# Patient Record
Sex: Female | Born: 1990 | Race: Black or African American | Hispanic: No | Marital: Single | Smoking: Former smoker
Health system: Southern US, Community
[De-identification: ages and names within clinical notes are randomized; demographics above are authoritative.]

## PROBLEM LIST (undated history)

## (undated) DIAGNOSIS — G7100 Muscular dystrophy, unspecified: Secondary | ICD-10-CM

## (undated) DIAGNOSIS — G600 Hereditary motor and sensory neuropathy: Secondary | ICD-10-CM

## (undated) HISTORY — PX: EXTERNAL EAR SURGERY: SHX627

---
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
## 2011-02-14 ENCOUNTER — Emergency Department (HOSPITAL_COMMUNITY)
Admission: EM | Admit: 2011-02-14 | Discharge: 2011-02-15 | Disposition: A | Discharge status: Home / Self Care | Payer: Self-pay | Attending: Emergency Medicine | Admitting: Emergency Medicine

## 2011-02-14 DIAGNOSIS — E860 Dehydration: Secondary | ICD-10-CM | POA: Insufficient documentation

## 2011-02-14 DIAGNOSIS — R112 Nausea with vomiting, unspecified: Secondary | ICD-10-CM | POA: Insufficient documentation

## 2011-02-14 DIAGNOSIS — R42 Dizziness and giddiness: Secondary | ICD-10-CM | POA: Insufficient documentation

## 2011-02-14 DIAGNOSIS — R1013 Epigastric pain: Secondary | ICD-10-CM | POA: Insufficient documentation

## 2011-02-14 DIAGNOSIS — K5900 Constipation, unspecified: Secondary | ICD-10-CM | POA: Insufficient documentation

## 2011-02-14 DIAGNOSIS — G7109 Other specified muscular dystrophies: Secondary | ICD-10-CM | POA: Insufficient documentation

## 2011-02-14 LAB — DIFFERENTIAL
Basophils Absolute: 0 10*3/uL (ref 0.0–0.1)
Basophils Relative: 0 % (ref 0–1)
Eosinophils Relative: 2 % (ref 0–5)
Lymphocytes Relative: 49 % — ABNORMAL HIGH (ref 12–46)
Neutro Abs: 3.8 10*3/uL (ref 1.7–7.7)

## 2011-02-14 LAB — CBC
HCT: 34.6 % — ABNORMAL LOW (ref 36.0–46.0)
Hemoglobin: 12.1 g/dL (ref 12.0–15.0)
RDW: 13.9 % (ref 11.5–15.5)
WBC: 9.3 10*3/uL (ref 4.0–10.5)

## 2011-02-15 LAB — URINALYSIS, ROUTINE W REFLEX MICROSCOPIC
Hgb urine dipstick: NEGATIVE
Nitrite: NEGATIVE
Protein, ur: NEGATIVE mg/dL
Specific Gravity, Urine: 1.027 (ref 1.005–1.030)
Urobilinogen, UA: 1 mg/dL (ref 0.0–1.0)

## 2011-02-15 LAB — HEPATIC FUNCTION PANEL
Albumin: 3.3 g/dL — ABNORMAL LOW (ref 3.5–5.2)
Alkaline Phosphatase: 82 U/L (ref 39–117)
Total Protein: 6.6 g/dL (ref 6.0–8.3)

## 2011-02-15 LAB — BASIC METABOLIC PANEL
Calcium: 8.8 mg/dL (ref 8.4–10.5)
GFR calc Af Amer: >60 mL/min (ref >60)
GFR calc non Af Amer: >60 mL/min (ref >60)
Glucose, Bld: 89 mg/dL (ref 70–99)
Potassium: 4 mEq/L (ref 3.5–5.1)
Sodium: 138 mEq/L (ref 135–145)

## 2011-02-15 LAB — LIPASE, BLOOD: Lipase: 19 U/L (ref 11–59)

## 2013-11-24 ENCOUNTER — Encounter (HOSPITAL_BASED_OUTPATIENT_CLINIC_OR_DEPARTMENT_OTHER): Payer: Self-pay | Admitting: Emergency Medicine

## 2013-11-24 ENCOUNTER — Emergency Department (HOSPITAL_BASED_OUTPATIENT_CLINIC_OR_DEPARTMENT_OTHER)
Admission: EM | Admit: 2013-11-24 | Discharge: 2013-11-24 | Disposition: A | Discharge status: Home / Self Care | Payer: Self-pay | Attending: Emergency Medicine | Admitting: Emergency Medicine

## 2013-11-24 DIAGNOSIS — K044 Acute apical periodontitis of pulpal origin: Secondary | ICD-10-CM | POA: Insufficient documentation

## 2013-11-24 DIAGNOSIS — F17200 Nicotine dependence, unspecified, uncomplicated: Secondary | ICD-10-CM | POA: Insufficient documentation

## 2013-11-24 DIAGNOSIS — K047 Periapical abscess without sinus: Secondary | ICD-10-CM | POA: Insufficient documentation

## 2013-11-24 DIAGNOSIS — K029 Dental caries, unspecified: Secondary | ICD-10-CM | POA: Insufficient documentation

## 2013-11-24 MED ORDER — TRAMADOL HCL 50 MG PO TABS
50.0000 mg | ORAL_TABLET | Freq: Four times a day (QID) | ORAL | Status: DC | PRN
Start: 2013-11-24 — End: 2015-09-12

## 2013-11-24 MED ORDER — AMOXICILLIN 500 MG PO CAPS: 500.0000 mg | ORAL_CAPSULE | Freq: Three times a day (TID) | ORAL | Status: DC

## 2013-11-24 NOTE — ED Notes (Signed)
Toothache since last night

## 2013-11-24 NOTE — ED Provider Notes (Signed)
CSN: [id number]     Arrival date & time 11/24/13  1614 History   First MD Initiated Contact with Patient 11/24/13 1624     Chief Complaint  Patient presents with  . Dental Pain     (Consider location/radiation/quality/duration/timing/severity/associated sxs/prior Treatment) HPI Comments: Christina Owens is a 23 y.o. Female presenting with a 1 day history of dental pain and gingival swelling.   The patient has a history of injury and/or decay in the tooth involved which has recently started to cause increased  Pain again,  The last episode occuring about 6 months ago.  There has been no fevers, chills, nausea or vomiting, also no complaint of difficulty swallowing, although chewing makes pain worse.  The patient has tried no medicines prior to arrival.    The history is provided by the patient.    History reviewed. No pertinent past medical history. History reviewed. No pertinent past surgical history. No family history on file. History  Substance Use Topics  . Smoking status: Current Every Day Smoker -- 0.50 packs/day    Types: Cigarettes  . Smokeless tobacco: Not on file  . Alcohol Use: Yes   OB History   Grav Para Term Preterm Abortions TAB SAB Ect Mult Living                 Review of Systems  Constitutional: Negative for fever.  HENT: Positive for dental problem. Negative for facial swelling and sore throat.   Respiratory: Negative for shortness of breath.   Musculoskeletal: Negative for neck pain and neck stiffness.      Allergies  Review of patient's allergies indicates no known allergies.  Home Medications   Current Outpatient Rx  Name  Route  Sig  Dispense  Refill  . amoxicillin (AMOXIL) 500 MG capsule   Oral   Take 1 capsule (500 mg total) by mouth 3 (three) times daily.   30 capsule   0   . traMADol (ULTRAM) 50 MG tablet   Oral   Take 1 tablet (50 mg total) by mouth every 6 (six) hours as needed.   20 tablet   0    BP 111/63  Pulse 63  Temp(Src)  98.1 F (36.7 C) (Oral)  Resp 20  Ht 5\' 3"  (1.6 m)  Wt 160 lb (72.576 kg)  BMI 28.35 kg/m2  SpO2 100%  LMP 10/28/2013 Physical Exam  Constitutional: She is oriented to person, place, and time. She appears well-developed and well-nourished. No distress.  HENT:  Head: Normocephalic and atraumatic.  Right Ear: Tympanic membrane and external ear normal.  Left Ear: Tympanic membrane and external ear normal.  Mouth/Throat: Oropharynx is clear and moist and mucous membranes are normal. No oral lesions. No trismus in the jaw. Dental abscesses present. No uvula swelling.    Erythema right posterior ear canal,  No drainage,  Pt denies pain.  Decay and fracture of left lower 3rd molar,  Mild surrounding gingival edema,  No fluctuance.  Eyes: Conjunctivae are normal.  Neck: Normal range of motion. Neck supple.  Cardiovascular: Normal rate and normal heart sounds.   Pulmonary/Chest: Effort normal.  Abdominal: She exhibits no distension.  Musculoskeletal: Normal range of motion.  Lymphadenopathy:    She has no cervical adenopathy.  Neurological: She is alert and oriented to person, place, and time.  Skin: Skin is warm and dry. No erythema.  Psychiatric: She has a normal mood and affect.    ED Course  Procedures (including critical care time) Labs  Review Labs Reviewed - No data to display Imaging Review No results found.  EKG Interpretation   None       MDM   Final diagnoses:  Dental infection  Dental decay    Patient with early simple dental infection, without widespread involvement.  She was prescribed amoxicillin, tramadol.  Dental referrals were given.. prn Followup anticipated.   Burgess Amor, PA-C 11/24/13 1657

## 2013-11-24 NOTE — ED Provider Notes (Signed)
Medical screening examination/treatment/procedure(s) were performed by non-physician practitioner and as supervising physician I was immediately available for consultation/collaboration.  EKG Interpretation   None         Murel Wigle, MD 11/24/13 2345 

## 2013-11-24 NOTE — Discharge Instructions (Signed)
Abscessed Tooth °An abscessed tooth is an infection around your tooth. It may be caused by holes or damage to the tooth (cavity) or a dental disease. An abscessed tooth causes mild to very bad pain in and around the tooth. See your dentist right away if you have tooth or gum pain. °HOME CARE °· Take your medicine as told. Finish it even if you start to feel better. °· Do not drive after taking pain medicine. °· Rinse your mouth (gargle) often with salt water (¼ teaspoon salt in 8 ounces of warm water). °· Do not apply heat to the outside of your face. °GET HELP RIGHT AWAY IF:  °· You have a temperature by mouth above 102° F (38.9° C), not controlled by medicine. °· You have chills and a very bad headache. °· You have problems breathing or swallowing. °· Your mouth will not open. °· You develop puffiness (swelling) on the neck or around the eye. °· Your pain is not helped by medicine. °· Your pain is getting worse instead of better. °MAKE SURE YOU:  °· Understand these instructions. °· Will watch your condition. °· Will get help right away if you are not doing well or get worse. °Document Released: 03/17/2008 Document Revised: 12/22/2011 Document Reviewed: 01/07/2011 °ExitCare® Patient Information ©2014 [employer]. ° ° °Emergency Department Resource Guide °1) Find a Doctor and Pay Out of Pocket °Although you won't have to find out who is covered by your insurance plan, it is a good idea to ask around and get recommendations. You will then need to call the office and see if the doctor you have chosen will accept you as a new patient and what types of options they offer for patients who are self-pay. Some doctors offer discounts or will set up payment plans for their patients who do not have insurance, but you will need to ask so you aren't surprised when you get to your appointment. ° °2) Contact Your Local Health Department °Not all health departments have doctors that can see patients for sick visits, but many  do, so it is worth a call to see if yours does. If you don't know where your local health department is, you can check in your phone book. The CDC also has a tool to help you locate your state's health department, and many state websites also have listings of all of their local health departments. ° °3) Find a Walk-in Clinic °If your illness is not likely to be very severe or complicated, you may want to try a walk in clinic. These are popping up all over the country in pharmacies, drugstores, and shopping centers. They're usually staffed by nurse practitioners or physician assistants that have been trained to treat common illnesses and complaints. They're usually fairly quick and inexpensive. However, if you have serious medical issues or chronic medical problems, these are probably not your best option. ° °No Primary Care Doctor: °- Call Health Connect at  832-8000 - they can help you locate a primary care doctor that  accepts your insurance, provides certain services, etc. °- Physician Referral Service- 1-800-533-3463 ° °Chronic Pain Problems: °Organization         Address  Phone   Notes  °[facility]  ([phone] Patients need to be referred by their primary care doctor.  ° °Medication Assistance: °Organization         Address  Phone   Notes  °[place] [address], [place] [address] [phone] --Must be a resident of [facility] -- Must have NO insurance coverage whatsoever (no Medicaid/ Medicare, etc.) -- The pt. MUST have a primary care doctor that directs their care regularly and follows them in the community   [facility]  [phone]   [employer]  210-354-9230    Agencies that provide inexpensive medical care: Organization         Address  Phone   Notes  [place]  [phone]   [facility]    [phone]   [facility] [place], [place] [address] [phone]   [place] [address], [place] [phone]   Planned Parenthood    [phone]   [facility]    [phone]   [place] [address], [place] Phone:  [phone], Fax:  [phone] Hours of Operation:  9 am - 6 pm, M-F.  Also accepts Medicaid/Medicare and self-pay.  [facility] [place], [address], [place] Phone: [phone], Fax: [phone]. Hours of Operation:  8:30 am - 5:30 pm, M-F.  Also accepts Medicaid and self-pay.  [facility] [place] [address], [place] Phone: [phone]   [place], [place], [place] [phone], Ext. [phone] Mondays & Thursdays: 7-9 AM.  First 15 patients are seen on a first come, first serve basis.    [place] Providers:  Organization         Address  Phone   Notes  [facility] [address], [place] [phone] Also accepts self-pay patients.  [facility] [id number] [place], [place]  [phone]   [place], [address], [place] [phone]   [facility] [address] [place] [phone]   Lucianne Lei [address], [place]   [phone] Only accepts [place] patients after they have their name applied to their card.   Self-Pay (no insurance) in [facility]:  Organization         Address  Phone   Notes  Sickle Cell Patients, [facility] [place] [phone]   [facility] [place] [phone]   [facility] [place]  [place], [place], [place] [phone]   [facility]/Dr. Osei-Bonsu  [address] [place] or  [address], [place] [phone] Phone number for both [place] and [place] locations is the same.  [facility] [address] [place] [phone]   [facility] [address] [place] or [address] [phone]   [facility] [address] [place] [phone], phone; [phone], fax Sees patients 1st and 3rd Saturday of every month.  Must not qualify for public or private insurance (i.e. Medicaid, Medicare, [place] Health Choice, Veterans' Benefits)  Household income should be no more than 200% of the poverty level The clinic cannot treat you if you are pregnant or think you are pregnant  Sexually transmitted diseases are not treated at the clinic.    Dental Care:  Organization         Address  Phone  Notes  °[facility] [address], [place] [phone] Accepts children up to age 21 who are enrolled in Medicaid or [place] Health Choice; pregnant women with a Medicaid card; and children who have applied for Medicaid or [place] Health Choice, but were declined, whose parents can pay a reduced fee at time of service.  °[facility] [place]  [address], [place] [phone] Accepts children up to age 21 who are enrolled in Medicaid or [place] Health Choice; pregnant women with a Medicaid card; and children who have applied for Medicaid or [place] Health Choice, but were declined, whose parents can pay a reduced fee at time of service.  °[facility] ° [address], [place] [phone] Patients are seen by appointment only. Walk-ins are not accepted. [facility] will see patients 18 years of age and older. °Monday - Tuesday (8am-5pm) °Most Wednesdays (8:30-5pm) °$30 per visit, cash only  °[facility] ° [address], [place] ([phone] Patients are seen by appointment only. Walk-ins are not accepted. [facility] will see patients 18 years of age and older. °One Wednesday Evening (Monthly: Volunteer Based).  $30 per visit, cash only  °[facility]  ([phone] for adults; Children under age 4, call Graduate Pediatric Dentistry at [phone]. Children aged 4-14, please call [phone] to request a pediatric application. ° Dental services are provided in all areas of dental care including fillings, crowns and bridges, complete and partial dentures, implants, gum treatment, root canals, and extractions. Preventive care is also provided. Treatment is provided to both adults and children. °Patients are selected via a lottery and there is often a waiting list. °  °[facility] [address], °[place] ° ([phone] [email or web address] °  °[facility] [address], [place], [place] ([phone], Ext. [phone] Second and Fourth Thursday of each month, opens at 6:30 AM; Clinic ends at 9 AM.  Patients are seen on a first-come first-served basis, and a limited number are seen during each clinic.  ° °[facility] ° [address], [place], [place] ([phone]) [phone]   Eligibility Requirements °You must have lived in [place], [place], or [place] counties for at least the last three months. °  You cannot be eligible for state or federal sponsored healthcare insurance, including Veterans Administration, Medicaid, or Medicare. °  You generally cannot be eligible for healthcare insurance through your employer.  °  How to apply: °Eligibility screenings are held every Tuesday and Wednesday afternoon from 1:00 pm until 4:00 pm. You do not need an appointment for the interview!  °[facility] [address], [place], [place] [phone]   °[facility]  [phone]   °[facility]  [phone]   °[place] [facility]  336-570-6415   ° °Behavioral Health Resources in the Community: °Intensive Outpatient Programs °Organization         Address  Phone  Notes  °[facility] [address], [place], [place] [phone]   °[facility] [address], [place], [place] [phone]   °[facility] [address], [place],  ° [phone]   °[facility] [address],  °[place],   [place] 2-440-102-7253 or 510-166-3212   Substance Abuse Resources Organization         Address  Phone  Notes  [facility]  (223)[phone]   [facility]  8055698013   [facility] [place]  [phone]   Floydene Flock  ([phone]   Residential & Outpatient Substance Abuse Program  [phone]   Psychological Services Organization         Address  Phone  Notes  [facility]  [phone]   [facility]  [phone]   [facility] [address], [place] [phone] or [phone]    Mobile Crisis Teams Organization         Address  Phone  Notes  [facility], Mobile Crisis Care Unit  3197508506   [facility]  [address] [place], [place] [phone]   Doristine Locks [address] [place] [phone]    Self-Help/Support Groups Organization         Address  Phone             Notes  [facility] [place] - variety of support groups  336- I7437963 Call for more information  Narcotics Anonymous (NA), [facility] [address], [employer] [place]  2 meetings at this location   Statistician         Address  Phone  Notes  [facility] [address],    [place]  [phone]   [facility]  [address], [place] [address], [place], [place] [phone]   [facility] [address] [place], [place] [phone]  Admissions: 8am-3pm M-F  [facility] [address]    [place], [place] [phone]   [facility] [address] [place], [place], [place] [phone]   [facility] [address],  [place], [place] [phone]   [facility] Intensive Outpatient [address], [place], [place] [phone]   [facility] ([facility] [address] [place].,  [place], [place] 6-222-979-8921 or [phone]   [facility] ([facility]) [address], [place], [place] [phone] Accepts Medicaid  [facility] [place] [address]  [place] [phone] Substance Abuse/Addiction Treatment   [facility] Organization         Address  Phone  Notes  [facility]  [phone]   Angie Fava, PhD [address] Ervin Knack [place], [place]   [phone] or [phone]   [facility]   [address] [place], [place] [phone]   [facility] [address] [place], [place] [phone] Insurance/Medicaid/sponsorship through [facility] [address]                                    [place], [place] [phone] Therapy/tele-psych/case  [facility] [address], [place] [phone]    Dr. Lolly Mustache  [phone]   [facility]  [employer] [facility] 1) [address] [place] 2) [address], [place] 3)  [address], [place] [phone]  [phone]   [facility] Child Abuse Hotline (343) 053-8214 or 787-001-6995 (After Hours)        Complete your entire course  of antibiotics as prescribed.  You  may use the tramadol for pain relief but do not drive within 4 hours of taking as this will make you drowsy.  Avoid applying heat or ice to this abscess area which can worsen your symptoms.  You may use warm salt water swish and spit  treatment or half peroxide and water swish and spit after meals to keep this area clean as discussed.  Call the dentist listed above for further management of your symptoms.

## 2015-09-12 ENCOUNTER — Encounter (HOSPITAL_BASED_OUTPATIENT_CLINIC_OR_DEPARTMENT_OTHER): Payer: Self-pay

## 2015-09-12 ENCOUNTER — Emergency Department (HOSPITAL_BASED_OUTPATIENT_CLINIC_OR_DEPARTMENT_OTHER): Payer: Self-pay

## 2015-09-12 ENCOUNTER — Emergency Department (HOSPITAL_BASED_OUTPATIENT_CLINIC_OR_DEPARTMENT_OTHER)
Admission: EM | Admit: 2015-09-12 | Discharge: 2015-09-12 | Disposition: A | Discharge status: Home / Self Care | Payer: Self-pay | Attending: Emergency Medicine | Admitting: Emergency Medicine

## 2015-09-12 DIAGNOSIS — R1031 Right lower quadrant pain: Secondary | ICD-10-CM | POA: Insufficient documentation

## 2015-09-12 DIAGNOSIS — O9989 Other specified diseases and conditions complicating pregnancy, childbirth and the puerperium: Secondary | ICD-10-CM | POA: Insufficient documentation

## 2015-09-12 DIAGNOSIS — N644 Mastodynia: Secondary | ICD-10-CM | POA: Insufficient documentation

## 2015-09-12 DIAGNOSIS — O99331 Smoking (tobacco) complicating pregnancy, first trimester: Secondary | ICD-10-CM | POA: Insufficient documentation

## 2015-09-12 DIAGNOSIS — Z3A01 Less than 8 weeks gestation of pregnancy: Secondary | ICD-10-CM | POA: Insufficient documentation

## 2015-09-12 DIAGNOSIS — F17210 Nicotine dependence, cigarettes, uncomplicated: Secondary | ICD-10-CM | POA: Insufficient documentation

## 2015-09-12 DIAGNOSIS — R1032 Left lower quadrant pain: Secondary | ICD-10-CM | POA: Insufficient documentation

## 2015-09-12 DIAGNOSIS — Z3490 Encounter for supervision of normal pregnancy, unspecified, unspecified trimester: Secondary | ICD-10-CM

## 2015-09-12 HISTORY — DX: Muscular dystrophy, unspecified: G71.00

## 2015-09-12 LAB — BASIC METABOLIC PANEL
Anion gap: 6 (ref 5–15)
BUN: 11 mg/dL (ref 6–20)
CHLORIDE: 109 mmol/L (ref 101–111)
CO2: 19 mmol/L — ABNORMAL LOW (ref 22–32)
Calcium: 8.7 mg/dL — ABNORMAL LOW (ref 8.9–10.3)
Creatinine, Ser: 0.56 mg/dL (ref 0.44–1.00)
GFR calc Af Amer: >60 mL/min (ref >60)
GFR calc non Af Amer: >60 mL/min (ref >60)
Glucose, Bld: 97 mg/dL (ref 65–99)
POTASSIUM: 3.7 mmol/L (ref 3.5–5.1)
SODIUM: 134 mmol/L — AB (ref 135–145)

## 2015-09-12 LAB — URINALYSIS, ROUTINE W REFLEX MICROSCOPIC
Bilirubin Urine: NEGATIVE
Glucose, UA: NEGATIVE mg/dL
Hgb urine dipstick: NEGATIVE
Ketones, ur: NEGATIVE mg/dL
Leukocytes, UA: NEGATIVE
Nitrite: NEGATIVE
Protein, ur: NEGATIVE mg/dL
Specific Gravity, Urine: 1.025 (ref 1.005–1.030)
pH: 7 (ref 5.0–8.0)

## 2015-09-12 LAB — CBC
HCT: 33.6 % — ABNORMAL LOW (ref 36.0–46.0)
Hemoglobin: 11.8 g/dL — ABNORMAL LOW (ref 12.0–15.0)
MCH: 29.3 pg (ref 26.0–34.0)
MCHC: 35.1 g/dL (ref 30.0–36.0)
MCV: 83.4 fL (ref 78.0–100.0)
Platelets: 269 10*3/uL (ref 150–400)
RBC: 4.03 MIL/uL (ref 3.87–5.11)
RDW: 13.8 % (ref 11.5–15.5)
WBC: 10.1 10*3/uL (ref 4.0–10.5)

## 2015-09-12 LAB — ABO/RH: ABO/RH(D): O POS

## 2015-09-12 LAB — PREGNANCY, URINE: Preg Test, Ur: POSITIVE — AB

## 2015-09-12 LAB — HCG, QUANTITATIVE, PREGNANCY: hCG, Beta Chain, Quant, S: 33037 m[IU]/mL — ABNORMAL HIGH (ref <5)

## 2015-09-12 MED ORDER — PRENATAL COMPLETE 14-0.4 MG PO TABS: 2.0000 | ORAL_TABLET | Freq: Every day | ORAL | Status: DC

## 2015-09-12 NOTE — ED Notes (Signed)
abd pain, breast tenderness x 3 days

## 2015-09-12 NOTE — Discharge Instructions (Signed)
1. Medications: do not take medications without discussing with your physician or OB/GYN 2. Treatment: rest, drink plenty of fluids, tylenol as needed for discomfort 3. Follow Up: Please followup with Ob/GYn in 7-10 days for discussion of your diagnoses and further evaluation after today's visit; if you do not have a primary care doctor use the resource guide provided to find one; Please return to the ER for worsening symptoms; return for your scheduled [place].    First Trimester of Pregnancy The first trimester of pregnancy is from week 1 until the end of week 12 (months 1 through 3). A week after a sperm fertilizes an egg, the egg will implant on the wall of the uterus. This embryo will begin to develop into a baby. Genes from you and your partner are forming the baby. The female genes determine whether the baby is a boy or a girl. At 6-8 weeks, the eyes and face are formed, and the heartbeat can be seen on ultrasound. At the end of 12 weeks, all the baby's organs are formed.  Now that you are pregnant, you will want to do everything you can to have a healthy baby. Two of the most important things are to get good prenatal care and to follow your health care provider's instructions. Prenatal care is all the medical care you receive before the baby's birth. This care will help prevent, find, and treat any problems during the pregnancy and childbirth. BODY CHANGES Your body goes through many changes during pregnancy. The changes vary from woman to woman.   You may gain or lose a couple of pounds at first.  You may feel sick to your stomach (nauseous) and throw up (vomit). If the vomiting is uncontrollable, call your health care provider.  You may tire easily.  You may develop headaches that can be relieved by medicines approved by your health care provider.  You may urinate more often. Painful urination may mean you have a bladder infection.  You may develop heartburn as a result of your  pregnancy.  You may develop constipation because certain hormones are causing the muscles that push waste through your intestines to slow down.  You may develop hemorrhoids or swollen, bulging veins (varicose veins).  Your breasts may begin to grow larger and become tender. Your nipples may stick out more, and the tissue that surrounds them (areola) may become darker.  Your gums may bleed and may be sensitive to brushing and flossing.  Dark spots or blotches (chloasma, mask of pregnancy) may develop on your face. This will likely fade after the baby is born.  Your menstrual periods will stop.  You may have a loss of appetite.  You may develop cravings for certain kinds of food.  You may have changes in your emotions from day to day, such as being excited to be pregnant or being concerned that something may go wrong with the pregnancy and baby.  You may have more vivid and strange dreams.  You may have changes in your hair. These can include thickening of your hair, rapid growth, and changes in texture. Some women also have hair loss during or after pregnancy, or hair that feels dry or thin. Your hair will most likely return to normal after your baby is born. WHAT TO EXPECT AT YOUR PRENATAL VISITS During a routine prenatal visit:  You will be weighed to make sure you and the baby are growing normally.  Your blood pressure will be taken.  Your abdomen will be  measured to track your baby's growth.  The fetal heartbeat will be listened to starting around week 10 or 12 of your pregnancy.  Test results from any previous visits will be discussed. Your health care provider may ask you:  How you are feeling.  If you are feeling the baby move.  If you have had any abnormal symptoms, such as leaking fluid, bleeding, severe headaches, or abdominal cramping.  If you are using any tobacco products, including cigarettes, chewing tobacco, and electronic cigarettes.  If you have any  questions. Other tests that may be performed during your first trimester include:  Blood tests to find your blood type and to check for the presence of any previous infections. They will also be used to check for low iron levels (anemia) and Rh antibodies. Later in the pregnancy, blood tests for diabetes will be done along with other tests if problems develop.  Urine tests to check for infections, diabetes, or protein in the urine.  An ultrasound to confirm the proper growth and development of the baby.  An amniocentesis to check for possible genetic problems.  Fetal screens for spina bifida and Down syndrome.  You may need other tests to make sure you and the baby are doing well.  HIV (human immunodeficiency virus) testing. Routine prenatal testing includes screening for HIV, unless you choose not to have this test. HOME CARE INSTRUCTIONS  Medicines  Follow your health care provider's instructions regarding medicine use. Specific medicines may be either safe or unsafe to take during pregnancy.  Take your prenatal vitamins as directed.  If you develop constipation, try taking a stool softener if your health care provider approves. Diet  Eat regular, well-balanced meals. Choose a variety of foods, such as meat or vegetable-based protein, fish, milk and low-fat dairy products, vegetables, fruits, and whole grain breads and cereals. Your health care provider will help you determine the amount of weight gain that is right for you.  Avoid raw meat and uncooked cheese. These carry germs that can cause birth defects in the baby.  Eating four or five small meals rather than three large meals a day may help relieve nausea and vomiting. If you start to feel nauseous, eating a few soda crackers can be helpful. Drinking liquids between meals instead of during meals also seems to help nausea and vomiting.  If you develop constipation, eat more high-fiber foods, such as fresh vegetables or fruit  and whole grains. Drink enough fluids to keep your urine clear or pale yellow. Activity and Exercise  Exercise only as directed by your health care provider. Exercising will help you:  Control your weight.  Stay in shape.  Be prepared for labor and delivery.  Experiencing pain or cramping in the lower abdomen or low back is a good sign that you should stop exercising. Check with your health care provider before continuing normal exercises.  Try to avoid standing for long periods of time. Move your legs often if you must stand in one place for a long time.  Avoid heavy lifting.  Wear low-heeled shoes, and practice good posture.  You may continue to have sex unless your health care provider directs you otherwise. Relief of Pain or Discomfort  Wear a good support bra for breast tenderness.   Take warm sitz baths to soothe any pain or discomfort caused by hemorrhoids. Use hemorrhoid cream if your health care provider approves.   Rest with your legs elevated if you have leg cramps or low back  pain.  If you develop varicose veins in your legs, wear support hose. Elevate your feet for 15 minutes, 3-4 times a day. Limit salt in your diet. Prenatal Care  Schedule your prenatal visits by the twelfth week of pregnancy. They are usually scheduled monthly at first, then more often in the last 2 months before delivery.  Write down your questions. Take them to your prenatal visits.  Keep all your prenatal visits as directed by your health care provider. Safety  Wear your seat belt at all times when driving.  Make a list of emergency phone numbers, including numbers for family, friends, the hospital, and police and fire departments. General Tips  Ask your health care provider for a referral to a local prenatal education class. Begin classes no later than at the beginning of month 6 of your pregnancy.  Ask for help if you have counseling or nutritional needs during pregnancy. Your  health care provider can offer advice or refer you to specialists for help with various needs.  Do not use hot tubs, steam rooms, or saunas.  Do not douche or use tampons or scented sanitary pads.  Do not cross your legs for long periods of time.  Avoid cat litter boxes and soil used by cats. These carry germs that can cause birth defects in the baby and possibly loss of the fetus by miscarriage or stillbirth.  Avoid all smoking, herbs, alcohol, and medicines not prescribed by your health care provider. Chemicals in these affect the formation and growth of the baby.  Do not use any tobacco products, including cigarettes, chewing tobacco, and electronic cigarettes. If you need help quitting, ask your health care provider. You may receive counseling support and other resources to help you quit.  Schedule a dentist appointment. At home, brush your teeth with a soft toothbrush and be gentle when you floss. SEEK MEDICAL CARE IF:   You have dizziness.  You have mild pelvic cramps, pelvic pressure, or nagging pain in the abdominal area.  You have persistent nausea, vomiting, or diarrhea.  You have a bad smelling vaginal discharge.  You have pain with urination.  You notice increased swelling in your face, hands, legs, or ankles. SEEK IMMEDIATE MEDICAL CARE IF:   You have a fever.  You are leaking fluid from your vagina.  You have spotting or bleeding from your vagina.  You have severe abdominal cramping or pain.  You have rapid weight gain or loss.  You vomit blood or material that looks like coffee grounds.  You are exposed to [place] measles and have never had them.  You are exposed to fifth disease or chickenpox.  You develop a severe headache.  You have shortness of breath.  You have any kind of trauma, such as from a fall or a car accident.   This information is not intended to replace advice given to you by your health care provider. Make sure you discuss any  questions you have with your health care provider.   Document Released: 09/23/2001 Document Revised: 10/20/2014 Document Reviewed: 08/09/2013 Elsevier Interactive Patient Education 2016 [employer].    Emergency Department Resource Guide 1) Find a Doctor and Pay Out of Pocket Although you won't have to find out who is covered by your insurance plan, it is a good idea to ask around and get recommendations. You will then need to call the office and see if the doctor you have chosen will accept you as a new patient and what types of  options they offer for patients who are self-pay. Some doctors offer discounts or will set up payment plans for their patients who do not have insurance, but you will need to ask so you aren't surprised when you get to your appointment.  2) Contact Your Local Health Department Not all health departments have doctors that can see patients for sick visits, but many do, so it is worth a call to see if yours does. If you don't know where your local health department is, you can check in your phone book. The CDC also has a tool to help you locate your state's health department, and many state websites also have listings of all of their local health departments.  3) Find a Walk-in Clinic If your illness is not likely to be very severe or complicated, you may want to try a walk in clinic. These are popping up all over the country in pharmacies, drugstores, and shopping centers. They're usually staffed by nurse practitioners or physician assistants that have been trained to treat common illnesses and complaints. They're usually fairly quick and inexpensive. However, if you have serious medical issues or chronic medical problems, these are probably not your best option.  No Primary Care Doctor: - Call Health Connect at  [phone] - they can help you locate a primary care doctor that  accepts your insurance, provides certain services, etc. - Physician Referral Service-  [phone]  Chronic Pain Problems: Organization         Address  Phone   Notes  [facility]  [phone] Patients need to be referred by their primary care doctor.   Medication Assistance: Organization         Address  Phone   Notes  [facility] Medication [facility] [address] [place], [place] [address] [phone] --Must be a resident of [place] [facility] [place] -- Must have NO insurance coverage whatsoever (no Medicaid/ Medicare, etc.) -- The pt. MUST have a primary care doctor that directs their care regularly and follows them in the community   MedAssist  [phone]   [employer]  [phone]    Agencies that provide inexpensive medical care: Organization         Address  Phone   Notes  [facility]  [phone]   [facility]    [phone]   [facility] [address] [place], [place] [address] [phone]   [facility] [address], [place] [phone]   [facility]    [phone]) [phone]   [facility]    [phone]   [facility]  [address], [place] Phone:  [phone], Fax:  [phone]) [phone] Hours of Operation:  9 am - 6 pm, M-F.  Also accepts Medicaid/Medicare and self-pay.  [facility]  [address], [place] Phone: [phone], Fax: [phone]. Hours of Operation:  8:30 am - 5:30 pm, M-F.  Also accepts Medicaid and self-pay.  [facility] [place] [address], [place] Phone: [phone]   [facility] [address], [place], [place] [phone], Ext. [phone] Mondays & Thursdays: 7-9 AM.  First 15 patients are seen on a first come, first serve basis.    Medicaid-accepting [facility] Providers:  Organization         Address  Phone   Notes  [employer]  [facility] [address],  [place] [phone] Also accepts self-pay patients.  [facility] [address] Laurell Josephs [place], [place]  [phone]   [facility] [address], [place] [phone]   [facility] [address] [place] [phone]   Renaye Rakers [address], [place]   [phone] Only accepts [place] Access [place] patients after they have their name applied to their card.   Self-Pay (no insurance) in [facility]:  Organization         Address  Phone   Notes  Sickle Cell Patients, [facility] [address] [place] [address] [place], [place] [phone]   [facility] [address] [place], [place] [phone]   [facility] [place]  [address],  [phone]   [facility]/Dr. Osei-Bonsu  [address] [place] or [address], [place] [phone] Phone number for both [place] and [place] locations is the same.  [facility] and [facility] [address], [place] [phone]   [facility] [address], [place] or [address] [phone]   [facility] [address], [place] [phone], phone; [phone], fax Sees patients 1st and 3rd Saturday of every month.  Must not qualify for public or private insurance (i.e. Medicaid, Medicare, [place] Health Choice, Veterans' Benefits)  Household income should be no more than 200% of the poverty level The clinic cannot treat you if you are pregnant or think you are pregnant  Sexually transmitted diseases are not treated at the clinic.    Dental Care: Organization         Address  Phone  Notes  [facility] Department of [facility] [address] [place], [place] [phone] Accepts children up to age 7 who are enrolled in  [place] or [place] Health Choice; pregnant women with a Medicaid card; and children who have applied for Medicaid or [place] Health Choice, but were declined, whose parents can pay a reduced fee at time of service.  [facility] Department of [facility]  [address], [place] [phone] Accepts children up to age 93 who are enrolled in [place] or [place] Health Choice; pregnant women with a Medicaid card; and children who have applied for Medicaid or [place] Health Choice, but were declined, whose parents can pay a reduced fee at time of service.  [facility]  [address] [place], [place] [phone] Patients are seen by appointment only. Walk-ins are not accepted. [facility] will see patients 66 years of age and older. Monday - Tuesday (8am-5pm) Most Wednesdays (8:30-5pm) $30 per visit, cash only  [facility] Adult Dental Access PROGRAM  [address], [facility] [phone] Patients are seen by appointment only. Walk-ins are not accepted. [facility] will see patients 15 years of age and older. One Wednesday Evening (Monthly: Volunteer Based).  $30 per visit, cash only  [employer]  [phone] for adults; Children under age 14, call Graduate Pediatric Dentistry at [phone]. Children aged 58-14, please call [phone] to request a pediatric application.  Dental services are provided in all areas of dental care including fillings, crowns and bridges, complete and partial dentures,  implants, gum treatment, root canals, and extractions. Preventive care is also provided. Treatment is provided to both adults and children. Patients are selected via a lottery and there is often a waiting list.   [facility] [address], [place]  [phone] [email or web address]   [facility] [address] [place], [place] [phone], Ext. [phone] Second and Fourth Thursday of each month, opens at 6:30  AM; Clinic ends at 9 AM.  Patients are seen on a first-come first-served basis, and a limited number are seen during each clinic.   [facility]  [address] Ether Griffins [place], [place] [phone]   Eligibility Requirements You must have lived in [place], [place], or [place] counties for at least the last three months.   You cannot be eligible for state or federal sponsored [place], including [employer], [place], or [employer].   You generally cannot be eligible for healthcare insurance through your employer.    How to apply: Eligibility screenings are held every Tuesday and Wednesday afternoon from 1:00 pm until 4:00 pm. You do not need an appointment for the interview!  [facility] [address], [place], [place] [phone]   [facility] Health Department  [phone]   [facility] Health Department  [phone]   [facility] Health Department  [phone]    Behavioral Health Resources in the Community: Intensive Outpatient Programs Organization         Address  Phone  Notes  [facility] [address] [place], [place] [phone]   [facility] [address], [place], [place] [phone]   [facility] [address] [place], [place]  [phone]   [facility] [address],  [place], [place] [phone] or [phone]   Substance Abuse Resources Organization         Address  Phone  Notes  [facility] Drug Services  [phone]   [facility]  [phone]   [facility]  [phone]   Floydene Flock  [phone]   Residential & Outpatient Substance Abuse Program  [phone]   Psychological Services Organization         Address  Phone  Notes  [facility] Health  [phone]   [facility] Services  [phone]   [facility] [address] [place] [phone] or  [phone]    Mobile Crisis Teams Organization         Address  Phone  Notes  [facility], Mobile Crisis Care Unit  [phone]   [facility]  [address] [place], [place] [phone]   Doristine Locks [address] [place] [phone]    Self-Help/Support Groups Organization         Address  Phone             Notes  [facility] [place] - variety of support groups  336- I7437963 Call for more information  Narcotics Anonymous (NA), [facility] [address], [employer] [place]  2 meetings at this location   Statistician         Address  Phone  Notes  [facility] [address],    [place]  [phone]   [facility]  [address] [place] [address], [place], [place] [phone]   [facility] [address] [place], [place] [phone] Admissions: 8am-3pm M-F  Incentives  [facility] Treatment Center [address]    [place], [place] [phone]   [facility] [address] [place], [place], [place] [phone]   [facility] [address]  [place], [place] [phone]   [facility] - Intensive Outpatient [address], [place], [place] [phone]   [facility] ([facility]iction [facility] [address] [place].,  [place], [place] [phone] or [phone]   [facility] ([facility]) [address], [place], [place] [phone] Accepts Medicaid  [facility] [address] [place] [address]  [place] [phone] Substance Abuse/Addiction Treatment   [facility] Organization         Address  Phone  Notes  [facility]  [phone]   Angie Fava, PhD [address] Ervin Knack [place], [place]   [phone] or [phone]   [facility]   [address] [place], [place] [phone]   [facility] [address],  [place], [place] [phone] Insurance/Medicaid/sponsorship through [facility] and Families [address]                                    [place], [place] [phone] Therapy/tele-psych/case  [facility] [address], [place] [phone]    Dr. Lolly Mustache  [phone]   [facility]  United [employer] [facility] Dept. 1) [address] [place] 2) [address] [place] 3)  [address], [place] [phone]  [phone]   [facility] Child Abuse Hotline [phone]) 636-0324 or 781-358-4846 (After Hours)

## 2015-09-12 NOTE — ED Notes (Signed)
Pt wants to refuse the US to get the ultrasound at the OB/GYN

## 2015-09-12 NOTE — ED Notes (Signed)
PA at bedside for assessment and information. Pt ambulated to bathroom.

## 2015-09-12 NOTE — ED Notes (Signed)
US at bedside for Appointment

## 2015-09-12 NOTE — ED Provider Notes (Signed)
CSN: [id number]     Arrival date & time 09/12/15  1912 History   First MD Initiated Contact with Patient 09/12/15 2002     Chief Complaint  Patient presents with  . Abdominal Pain     (Consider location/radiation/quality/duration/timing/severity/associated sxs/prior Treatment) The history is provided by the patient and medical records. No language interpreter was used.     Christina Owens is a 24 y.o. female  G1P0 with a hx of muscular dystrophy presents to the Emergency Department complaining of gradual, persistent, lower abd cramping waxing and waning onset 3 days ago. Associated symptoms include bilateral breast tenderness and lower abd fullness. Pt reports nausea and NBNB x1 approx 1 week ago.  Pt reports she is sexually active with 1 female partner and was not attempting to prevent a pregnancy.  Pt denies vaginal bleeding, vaginal discharge.  Nothing makes it better and nothing makes it worse.  Pt denies fever, chills, headache, neck pain, chest pain.   LMP: Jul 14, 2015.  Pt is resting comfortably.    Past Medical History  Diagnosis Date  . Muscular dystrophy [facility](HCC)    Past Surgical History  Procedure Laterality Date  . External ear surgery     No family history on file. Social History  Substance Use Topics  . Smoking status: Current Some Day Smoker -- 0.50 packs/day    Types: Cigarettes  . Smokeless tobacco: None  . Alcohol Use: Yes     Comment: occ   OB History    No data available     Review of Systems  Constitutional: Negative for fever, diaphoresis, appetite change, fatigue and unexpected weight change.  HENT: Negative for mouth sores.   Eyes: Negative for visual disturbance.  Respiratory: Negative for cough, chest tightness, shortness of breath and wheezing.   Cardiovascular: Negative for chest pain.  Gastrointestinal: Positive for abdominal pain (mild, cramping). Negative for nausea, vomiting, diarrhea and constipation.  Endocrine: Negative for polydipsia,  polyphagia and polyuria.  Genitourinary: Negative for dysuria, urgency, frequency and hematuria.       Breast tenderness  Musculoskeletal: Negative for back pain and neck stiffness.  Skin: Negative for rash.  Allergic/Immunologic: Negative for immunocompromised state.  Neurological: Negative for syncope, light-headedness and headaches.  Hematological: Does not bruise/bleed easily.  Psychiatric/Behavioral: Negative for sleep disturbance. The patient is not nervous/anxious.       Allergies  Peanut-containing drug products  Home Medications   Prior to Admission medications   Medication Sig Start Date End Date Taking? Authorizing Provider  Prenatal Vit-Fe Fumarate-FA (PRENATAL COMPLETE) 14-0.4 MG TABS Take 2 tablets by mouth daily. 09/12/15   Darcelle Herrada, PA-C   BP 99/67 mmHg  Pulse 61  Temp(Src) 98.3 F (36.8 C) (Oral)  Resp 16  Ht 5\' 4"  (1.626 m)  Wt 70.761 kg  BMI 26.76 kg/m2  SpO2 100%  LMP 07/14/2015 Physical Exam  Constitutional: She appears well-developed and well-nourished. No distress.  Awake, alert, nontoxic appearance  HENT:  Head: Normocephalic and atraumatic.  Mouth/Throat: Oropharynx is clear and moist. No oropharyngeal exudate.  Eyes: Conjunctivae are normal. No scleral icterus.  Neck: Normal range of motion. Neck supple.  Cardiovascular: Normal rate, regular rhythm, normal heart sounds and intact distal pulses.   Pulmonary/Chest: Effort normal and breath sounds normal. No respiratory distress. She has no wheezes.  Equal chest expansion  Abdominal: Soft. Bowel sounds are normal. She exhibits no distension and no mass. There is no tenderness. There is no rebound and no guarding.  Soft  and nontender No palpable masses  Musculoskeletal: Normal range of motion. She exhibits no edema.  Neurological: She is alert.  Speech is clear and goal oriented Moves extremities without ataxia  Skin: Skin is warm and dry. She is not diaphoretic. No erythema.   Psychiatric: She has a normal mood and affect.  Nursing note and vitals reviewed.   ED Course  Procedures (including critical care time) Labs Review Labs Reviewed  URINALYSIS, ROUTINE W REFLEX MICROSCOPIC (NOT AT [facility]) - Abnormal; Notable for the following:    APPearance CLOUDY (*)    All other components within normal limits  PREGNANCY, URINE - Abnormal; Notable for the following:    Preg Test, Ur POSITIVE (*)    All other components within normal limits  CBC - Abnormal; Notable for the following:    Hemoglobin 11.8 (*)    HCT 33.6 (*)    All other components within normal limits  BASIC METABOLIC PANEL  HCG, QUANTITATIVE, PREGNANCY  ABO/RH     MDM   Final diagnoses:  Pregnancy  Breast tenderness in female  Bilateral lower abdominal cramping   Christina Owens presents with mild, bilateral lower abdominal cramping without vaginal bleeding. Patient also complains of breast tenderness. Pregnancy test positive.  Labs drawn and ultrasound ordered to rule out ectopic pregnancy.  9:53 PM Patient reports she cannot wait for her ultrasound as her ride is leaving.  Patient is well-appearing. I highly doubt ectopic pregnancy however will schedule patient with outpatient ultrasound to verify IUP. Should return precautions given including worsening pain, dehydration or vaginal bleeding. Patient is to follow-up with her OB/GYN.  Patient given prenatal vitamins.  BP 99/67 mmHg  Pulse 61  Temp(Src) 98.3 F (36.8 C) (Oral)  Resp 16  Ht  (1.626 m)  Wt 70.761 kg  BMI 26.76 kg/m2  SpO2 100%  LMP 07/14/2015   Dierdre Forth, PA-C 09/12/15 2158  Geoffery Lyons, MD 09/12/15 [phone]

## 2015-09-13 ENCOUNTER — Other Ambulatory Visit (HOSPITAL_BASED_OUTPATIENT_CLINIC_OR_DEPARTMENT_OTHER): Payer: Self-pay | Admitting: Physician Assistant

## 2015-09-13 DIAGNOSIS — R102 Pelvic and perineal pain: Principal | ICD-10-CM

## 2015-09-13 DIAGNOSIS — O26891 Other specified pregnancy related conditions, first trimester: Secondary | ICD-10-CM

## 2015-09-15 ENCOUNTER — Ambulatory Visit (HOSPITAL_BASED_OUTPATIENT_CLINIC_OR_DEPARTMENT_OTHER): Payer: Self-pay

## 2015-09-21 ENCOUNTER — Ambulatory Visit (HOSPITAL_BASED_OUTPATIENT_CLINIC_OR_DEPARTMENT_OTHER): Payer: Medicaid Other

## 2015-09-21 ENCOUNTER — Ambulatory Visit (HOSPITAL_BASED_OUTPATIENT_CLINIC_OR_DEPARTMENT_OTHER): Payer: Self-pay

## 2015-09-22 ENCOUNTER — Ambulatory Visit (HOSPITAL_BASED_OUTPATIENT_CLINIC_OR_DEPARTMENT_OTHER): Payer: Self-pay

## 2015-10-30 ENCOUNTER — Encounter (HOSPITAL_COMMUNITY): Payer: Self-pay

## 2015-10-30 ENCOUNTER — Encounter (HOSPITAL_COMMUNITY): Payer: Medicaid Other

## 2015-10-30 ENCOUNTER — Ambulatory Visit (HOSPITAL_COMMUNITY)
Admission: RE | Admit: 2015-10-30 | Discharge: 2015-10-30 | Disposition: A | Discharge status: Home / Self Care | Payer: Medicaid Other | Source: Ambulatory Visit | Attending: Specialist | Admitting: Specialist

## 2015-10-30 VITALS — BP 106/65 | HR 90 | Wt 166.8 lb

## 2015-10-30 DIAGNOSIS — Z87891 Personal history of nicotine dependence: Secondary | ICD-10-CM | POA: Insufficient documentation

## 2015-10-30 DIAGNOSIS — Z3A12 12 weeks gestation of pregnancy: Secondary | ICD-10-CM | POA: Insufficient documentation

## 2015-10-30 DIAGNOSIS — G600 Hereditary motor and sensory neuropathy: Secondary | ICD-10-CM | POA: Diagnosis not present

## 2015-10-30 DIAGNOSIS — O26891 Other specified pregnancy related conditions, first trimester: Secondary | ICD-10-CM | POA: Insufficient documentation

## 2015-10-30 NOTE — Consult Note (Signed)
MFM consult   25 yr old G1P0 at [redacted]w[redacted]d with Charcot-Marie Tooth disease referred by Dr. 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 She reports when she walks or writes for a prolonged period of time she gets numbness and muscle weakness- she reports her symptoms are more mild than her other family members  PSH: merongotomy tubes Medications: none Allergies: peanuts Social hx: stopped tobacco use with pregnancy; no alcohol or illicit drug use in pregnancy Family hx: father, paternal uncle, paternal cousin, and paternal grandmother all have Charcot-Marie Tooth disease  Patient reports her pregnancy has been uncomplicated to date  I counseled her as follows: 1. Charcot-Marie Tooth disease: - discussed this is a progressive disease that affects the peripheral nerves - Symptoms of Charcot-Marie-Tooth disease vary in severity, even among members of the same family. Some people never realize they have the disorder, but most have a moderate amount of physical disability. A small percentage of people experience severe weakness or other problems which, in rare cases, can be life-threatening. In most affected individuals, however, Charcot-Marie-Tooth disease does not affect life expectancy. - there is limited data on Charcot-Marie-Tooth disease in pregnancy - discussed pregnancies of mothers with Charcot-Marie-Tooth are 2 times more likely to have presentation anomalies and are twice as likely to require operative deliveries, such as forceps- or vacuum-assisted vaginal deliveries or cesarean deliveries.  Mothers with Charcot-Marie-Tooth  are twice as likely to encounter severe postpartum bleeding, possibly related to uterine atony caused by neuropathy of uterine adrenergic nerves. Thus, immediate treatment of a postpartum hemorrhage  needs to be anticipated. If a woman was diagnosed with Charcot-Marie-Tooth  in early childhood, there is a 50% risk of deterioration of the disease during pregnancy.  About one-third of the women with exacerbations or deterioration reported improvement after delivery, but the other two-thirds experienced progressive worsening of symptoms in the postnatal period. Women who experienced exacerbations in their first pregnancies generally had a high risk of exacerbations in subsequent pregnancies. Women with adult-onset Charcot-Marie-Tooth , interestingly, did not typically report any worsening of symptoms during pregnancy. ortunately, affected infants are not expected to have any symptoms at birth.   I also discussed genetic inheritance. Charcot-Marie-Tooth can be inherited in an autosomal dominant, autosomal recessive, or X linked fashion. The most common is autosomal dominant and I discussed this is most likely the case for Ms. Moses given her strong family history with multiple members affected and each generation affected.  Since most individuals have a dominant form of the condition, the risk for offspring to be affected is up to 50%.  Discussed option of amniocentesis to determine if her fetus is affected. Patient declined and wishes to have her baby tested after delivery. Recommend inform Pediatrics at delivery.   2. I do not see any contraindication to vaginal delivery or regional anesthesia 3. I recommend an Anesthesia consult in the third trimester 4. I recommend evaluate fetal growth in the third trimester  I spent a total of 45 minutes with the patient of which >50% was in face to face consultation.  Please call with questions.  Eulis Foster, MD

## 2015-11-27 ENCOUNTER — Other Ambulatory Visit (HOSPITAL_COMMUNITY): Payer: Self-pay | Admitting: Obstetrics and Gynecology

## 2015-11-27 DIAGNOSIS — O289 Unspecified abnormal findings on antenatal screening of mother: Secondary | ICD-10-CM

## 2015-11-27 DIAGNOSIS — Z3689 Encounter for other specified antenatal screening: Secondary | ICD-10-CM

## 2015-11-30 ENCOUNTER — Other Ambulatory Visit (HOSPITAL_COMMUNITY): Payer: Self-pay | Admitting: Obstetrics and Gynecology

## 2015-11-30 ENCOUNTER — Encounter (HOSPITAL_COMMUNITY): Payer: Self-pay

## 2015-11-30 ENCOUNTER — Ambulatory Visit (HOSPITAL_COMMUNITY)
Admission: RE | Admit: 2015-11-30 | Discharge: 2015-11-30 | Disposition: A | Discharge status: Home / Self Care | Payer: Medicaid Other | Source: Ambulatory Visit | Attending: Obstetrics and Gynecology | Admitting: Obstetrics and Gynecology

## 2015-11-30 VITALS — BP 109/66 | HR 61 | Wt 174.2 lb

## 2015-11-30 DIAGNOSIS — IMO0002 Reserved for concepts with insufficient information to code with codable children: Secondary | ICD-10-CM

## 2015-11-30 DIAGNOSIS — O289 Unspecified abnormal findings on antenatal screening of mother: Secondary | ICD-10-CM

## 2015-11-30 DIAGNOSIS — O99332 Smoking (tobacco) complicating pregnancy, second trimester: Secondary | ICD-10-CM | POA: Insufficient documentation

## 2015-11-30 DIAGNOSIS — O283 Abnormal ultrasonic finding on antenatal screening of mother: Secondary | ICD-10-CM | POA: Insufficient documentation

## 2015-11-30 DIAGNOSIS — O352XX0 Maternal care for (suspected) hereditary disease in fetus, not applicable or unspecified: Secondary | ICD-10-CM

## 2015-11-30 DIAGNOSIS — Z3689 Encounter for other specified antenatal screening: Secondary | ICD-10-CM

## 2015-11-30 DIAGNOSIS — O2692 Pregnancy related conditions, unspecified, second trimester: Secondary | ICD-10-CM

## 2015-11-30 DIAGNOSIS — Z3A17 17 weeks gestation of pregnancy: Secondary | ICD-10-CM | POA: Diagnosis not present

## 2015-11-30 DIAGNOSIS — Z315 Encounter for genetic counseling: Secondary | ICD-10-CM | POA: Diagnosis not present

## 2015-11-30 DIAGNOSIS — Z820 Family history of epilepsy and other diseases of the nervous system: Secondary | ICD-10-CM | POA: Insufficient documentation

## 2015-11-30 DIAGNOSIS — O280 Abnormal hematological finding on antenatal screening of mother: Secondary | ICD-10-CM

## 2015-11-30 DIAGNOSIS — Z0489 Encounter for examination and observation for other specified reasons: Secondary | ICD-10-CM

## 2015-11-30 NOTE — Progress Notes (Signed)
Genetic Counseling  High-Risk Gestation Note  Appointment Date:  11/30/2015 Referred By: Lin Landsman., MD Date of Birth:  1991-01-25 Partner:  Hermelinda Dellen   Pregnancy History: G1P0 Estimated Date of Delivery: 05/06/16 Estimated Gestational Age: [redacted]w[redacted]d Attending: Particia Nearing, MD   Ms. Buelah Manis and her partner, Mr. Hermelinda Dellen, were seen for consultation for genetic counseling because of an elevated MSAFP of  2.87 MoMs based on maternal serum screening through [employer].    In Summary:  Elevated MSAFP of 2.87 MoM; Discussed differentials for causes of elevation  Detailed ultrasound performed today; No signs of ONTD on today's ultrasound; Limitations of ultrasound reviewed with the patient  Declined amniocentesis  Patient has personal and family history of Charcot-Marie-Tooth; unsure of specific type, no known genetic testing performed  Family history most consistent with autosomal dominant form  Discussed 1 in 2 (50%) recurrence risk for CMT for each of Ms. Scheffel's pregnancies in the case of autosomal dominant CMT  Previous MFM consultation performed regarding this medical history; see previous MFM note  Patient reported she has sickle cell trait; Records were not available to confirm this report at this time  Father of the pregnancy reported no known family history of sickle cell trait  Patient planned to ask her OB provider at next visit regarding sickle cell screening  Sickle cell screening (via hemoglobin electrophoresis) was declined today by patient and partner; they stated they are comfortable with newborn screening and would not consider prenatal diagnosis if both were carriers  We reviewed Ms. Knotts's maternal serum screening result, the elevation of MSAFP, and the associated 1 in 228 risk for a fetal open neural tube defect.   We reviewed ONTDs, the typical multifactorial etiology, and variable prognosis.  In addition, we discussed  additional explanations for an elevated MSAFP including: normal variation, twins, feto-maternal bleeding, a gestational dating error, abdominal wall defects, kidney differences, oligohydramnios, and placental problems.  We discussed that an unexplained elevation of MSAFP is associated with an increased risk for third trimester complications including: prematurity, low birth weight, and pre-eclampsia.  Additionally, we reviewed the [facility] screen's reduction in risks for fetal Down syndrome (1 in 24,000) and fetal Trisomy 18.   We reviewed additional available screening and diagnostic options including detailed ultrasound and amniocentesis.  We discussed the risks, limitations, and benefits of each. Detailed ultrasound was performed today. No structural abnormalities were visualized to explain the elevated MSAFP; specifically, the fetal spine, posterior fossa, and fetal abdominal wall were visualized and appeared normal. Complete ultrasound results reported separately.  After thoughtful consideration of these options, Ms. Cuthbert declined amniocentesis.  She understands that ultrasound cannot rule out all birth defects or genetic syndromes.  However, she was counseled that 80-90% of fetuses with ONTDs can be detected by detailed 2nd trimester ultrasound, when well visualized.   Both family histories were reviewed and found to be contributory for Charcot-Marie-Tooth (CMT) Neuropathy for the patient. She reported that following paternal relatives also have or had CMT: her father, her paternal half-brother, paternal grandmother, paternal uncle, paternal first cousin (the affected uncle's daughter), and one of the first cousin's children. The patient was unsure of the specific type of CMT. She reported that she was diagnosed in childhood given delayed motor milestones. The family thinks the diagnosis originally was determined from evaluation at [facility]. The patient's mother reported that Ms. Abebe had testing by  neurology at the time of diagnosis, but it was unclear what specific type of  testing was performed. Ms. Aispuro reported that if she writes for a long period of time, her hands will cramp, and she reported that she gets tired if she walks too long a period of time. She also reported that she has high arches, and reportedly toe walked as a child. The patient's father is reported to be currently 61 years old and has symptoms of CMT primarily in his hands and feet. The patient reported that her father has recently started to have more significant hand cramping, with his fingers tending to stay in a clenched position while resting. She reported that her grandmother passed away at age 61 years old. Her hands were described to have clenched fingers, and she was in a wheelchair. The patient reported that her grandmother was followed by Dr. Windle Guard with [facility] in [place], [place], and Ms. Dunnigan reported that she also saw Dr. Windle Guard in 2010. Ms. Marcom reported that she is not currently followed regularly by neurology. The patient did not know if genetic testing had been performed for herself or her relatives. She gave [place] written permission to request medical records from her most recent neurology evaluation in 2010. Medical records request is currently pending.   We reviewed that Charcot-Marie-Tooth (CMT) hereditary neuropathy  refers to a group of conditions characterized by a chronic motor and sensory polyneuropathy.  The prevalence is estimated to be 1 in 3,300. We reviewed that affected individuals typically have distal muscle weakness and atrophy, with mild to moderate sensory loss, depressed tendon reflexes, and high-arched feet.  We reviewed that CMT needs to be distinguished from the many causes of acquired neuropathies.  Clinical diagnosis is usually based on family history and the presence of characteristic findings, and EMG/NCV testing.    Ms. Suchecki was counseled that the genetics of CMT is very  complex, as there are greater than 40 genes known to be associated with this condition.  CMT is typically classified into sub-types. CMT hereditary neuropathy can be inherited in an autosomal dominant, autosomal recessive, and X-linked manner.  Given the reported family history, we discussed that the type of CMT in the family is most likely following autosomal dominant inheritance. The most common sub-type of CMT is CMT1, estimated to account for 40-50% of all cases of CMT, and autosomal dominant inheritance is observed for type 1. CMT1 is described to typically be slowly progressive, associated with pes cavus foot deformity and bilateral foot drop, and affected individuals typically display symptoms between ages 53 and 25 years. We reviewed genes and autosomal dominant inheritance, which means having one copy of a nonworking gene change in a particular gene pair causes the particular condition. Each offspring of an affected individual has 1 in 2 (50%) chance to inherit the autosomal dominant condition; males and females have equal chances to inherit the causative gene change. Thus, given the reported family history, the current pregnancy (and each of Ms. Vandenbrink's pregnancies) would have a 1 in 2 (50%) chance to inherit CMT, if the form in the family is an autosomal dominant form. Additional information regarding the specific condition in the family may alter recurrence risk assessment. Ms. Sweney previously had MFM consultation to discuss pregnancy management, given this history. See previous MFM consult note for detailed discussion.   We discussed that molecular genetic testing is available for some of the genes involved in CMT. However, we reviewed the complexity of genetics of CMT. For example, for CMT1 alone, there are 5 genes described that can cause this  subtype. Thus, genetic testing is typically guided by the clinical diagnosis of CMT subtype and then testing is initiated for an individual with a clinical  diagnosis. If the underlying genetic cause is identified, genetic testing would be available to at-risk relatives for that particular causative gene change. We reviewed that if an underlying genetic cause were determined for Ms. Nottingham's family, prenatal diagnosis could potentially be available via amniocentesis. We reviewed risks, benefits, and limitations of amniocentesis. Ms. Gieselman stated that she would not be interested in amniocentesis/prenatal diagnosis for CMT. She, thus, expressed that she is also not interested in genetic testing for herself at this time. We discussed that it would be important for the patient to inform her child's pediatrician of this history, so that the child can be evaluated for potential symptoms, when appropriate. Regarding genetic testing, if a familial causative genetic change is identified, genetic testing of asymptomatic children is typically discouraged. Further genetic counseling is warranted if additional information is discovered.    Ms. KINLEY DOZIER was provided with written information regarding sickle cell anemia (SCA) including the carrier frequency and incidence in the African American population, the availability of carrier testing and prenatal diagnosis if indicated.  In addition, we discussed that hemoglobinopathies are routinely screened for as part of the [facility] newborn screening panel.  She reported that her maternal half-sister's daughter has sickle cell trait, and Ms. Afonso also reported that she, herself has sickle cell trait. Medical documentation was not available at the time of today's visit to confirm the patient's report of her sickle cell carrier status. The father of the pregnancy reported no known family history of sickle cell anemia or sickle cell trait; he has not had screening, to the best of his knowledge. We discussed the option of hemoglobin electrophoresis for the patient to confirm her report of sickle cell trait. If she has sickle cell trait, we  discussed that the pregnancy would be considered to be at increased risk for a hemoglobinopathy if the father of the pregnancy also carries a hemoglobin variant. Screening for hemoglobin variants is available to the father of the pregnancy via complete blood count and hemoglobin electrophoresis, if desired. The patient and her partner declined hemoglobin electrophoresis today.  They stated that they were comfortable with newborn screening for hemoglobin variants.   Ms. YAMILETTE GARRETSON denied exposure to environmental toxins or chemical agents. She denied the use of alcohol or tobacco. She reported smoking marijuana daily but reported cutting back on the amount, currently smoking approximately 1 blunt per day. Prior to reviewing the specific exposures, we reviewed the general principles of teratogenic agents. Every pregnancy carries the risk for congenital anomalies of approximately 3-5%. To show that an agent is teratogenic, the rate of abnormalities for exposed pregnancies must be greater than that expected due to the background risk.  The dosage and timing of an exposure are also very important, with the first trimester of pregnancy being the most critical. We reviewed that available data regarding recreational use of marijuana in pregnancy have not indicated an association with increased risk for birth defects. Some studies have indicated a possible association with prenatal marijuana use and decreased fetal growth. Given this association, we discussed that no marijuana use in pregnancy is recommended. She denied significant viral illnesses during the course of her pregnancy.   I counseled this couple for approximately 45 minutes regarding the above risks and available options.    Quinn Plowman, MS,  Certified Genetic Counselor 11/30/2015

## 2015-12-03 ENCOUNTER — Other Ambulatory Visit (HOSPITAL_COMMUNITY): Payer: Self-pay

## 2015-12-21 ENCOUNTER — Other Ambulatory Visit (HOSPITAL_COMMUNITY): Payer: Self-pay

## 2015-12-21 ENCOUNTER — Other Ambulatory Visit (HOSPITAL_COMMUNITY): Payer: Self-pay | Admitting: Maternal and Fetal Medicine

## 2015-12-21 ENCOUNTER — Ambulatory Visit (HOSPITAL_COMMUNITY)
Admission: RE | Admit: 2015-12-21 | Discharge: 2015-12-21 | Disposition: A | Discharge status: Home / Self Care | Payer: Medicaid Other | Source: Ambulatory Visit | Attending: Obstetrics and Gynecology | Admitting: Obstetrics and Gynecology

## 2015-12-21 VITALS — BP 122/64 | HR 86 | Wt 175.6 lb

## 2015-12-21 DIAGNOSIS — O99332 Smoking (tobacco) complicating pregnancy, second trimester: Secondary | ICD-10-CM | POA: Insufficient documentation

## 2015-12-21 DIAGNOSIS — IMO0002 Reserved for concepts with insufficient information to code with codable children: Secondary | ICD-10-CM

## 2015-12-21 DIAGNOSIS — O283 Abnormal ultrasonic finding on antenatal screening of mother: Secondary | ICD-10-CM | POA: Insufficient documentation

## 2015-12-21 DIAGNOSIS — Z0489 Encounter for examination and observation for other specified reasons: Secondary | ICD-10-CM

## 2015-12-21 DIAGNOSIS — O2692 Pregnancy related conditions, unspecified, second trimester: Secondary | ICD-10-CM

## 2015-12-21 DIAGNOSIS — Z36 Encounter for antenatal screening of mother: Secondary | ICD-10-CM | POA: Diagnosis not present

## 2015-12-21 DIAGNOSIS — Z3A20 20 weeks gestation of pregnancy: Secondary | ICD-10-CM | POA: Insufficient documentation

## 2015-12-21 DIAGNOSIS — Z820 Family history of epilepsy and other diseases of the nervous system: Secondary | ICD-10-CM

## 2015-12-21 DIAGNOSIS — O280 Abnormal hematological finding on antenatal screening of mother: Secondary | ICD-10-CM

## 2015-12-21 DIAGNOSIS — O352XX0 Maternal care for (suspected) hereditary disease in fetus, not applicable or unspecified: Secondary | ICD-10-CM

## 2015-12-21 DIAGNOSIS — O289 Unspecified abnormal findings on antenatal screening of mother: Secondary | ICD-10-CM

## 2015-12-24 ENCOUNTER — Other Ambulatory Visit (HOSPITAL_COMMUNITY): Payer: Self-pay | Admitting: *Deleted

## 2015-12-24 DIAGNOSIS — O281 Abnormal biochemical finding on antenatal screening of mother: Secondary | ICD-10-CM

## 2016-01-01 ENCOUNTER — Other Ambulatory Visit (HOSPITAL_COMMUNITY): Payer: Self-pay

## 2016-01-09 ENCOUNTER — Encounter (HOSPITAL_BASED_OUTPATIENT_CLINIC_OR_DEPARTMENT_OTHER): Payer: Self-pay

## 2016-01-09 ENCOUNTER — Emergency Department (HOSPITAL_BASED_OUTPATIENT_CLINIC_OR_DEPARTMENT_OTHER)
Admission: EM | Admit: 2016-01-09 | Discharge: 2016-01-09 | Disposition: A | Discharge status: Home / Self Care | Payer: Medicaid Other | Attending: Emergency Medicine | Admitting: Emergency Medicine

## 2016-01-09 DIAGNOSIS — Z79899 Other long term (current) drug therapy: Secondary | ICD-10-CM | POA: Diagnosis not present

## 2016-01-09 DIAGNOSIS — O99512 Diseases of the respiratory system complicating pregnancy, second trimester: Secondary | ICD-10-CM | POA: Diagnosis not present

## 2016-01-09 DIAGNOSIS — K047 Periapical abscess without sinus: Secondary | ICD-10-CM | POA: Diagnosis not present

## 2016-01-09 DIAGNOSIS — Z3A20 20 weeks gestation of pregnancy: Secondary | ICD-10-CM | POA: Insufficient documentation

## 2016-01-09 DIAGNOSIS — Z87891 Personal history of nicotine dependence: Secondary | ICD-10-CM | POA: Insufficient documentation

## 2016-01-09 DIAGNOSIS — J029 Acute pharyngitis, unspecified: Secondary | ICD-10-CM | POA: Diagnosis not present

## 2016-01-09 DIAGNOSIS — Z8669 Personal history of other diseases of the nervous system and sense organs: Secondary | ICD-10-CM | POA: Diagnosis not present

## 2016-01-09 DIAGNOSIS — O99612 Diseases of the digestive system complicating pregnancy, second trimester: Secondary | ICD-10-CM | POA: Insufficient documentation

## 2016-01-09 MED ORDER — PENICILLIN V POTASSIUM 500 MG PO TABS: 500.0000 mg | ORAL_TABLET | Freq: Four times a day (QID) | ORAL | Status: AC

## 2016-01-09 NOTE — ED Notes (Signed)
Left lower toothache x 2 days-pt is 5mos pregnant

## 2016-01-09 NOTE — ED Provider Notes (Signed)
CSN: [id number]     Arrival date & time 01/09/16  1827 History   None    Chief Complaint  Patient presents with  . Dental Pain     (Consider location/radiation/quality/duration/timing/severity/associated sxs/prior Treatment) The history is provided by the patient.    Pt presents left lower dental pain and associated facial swelling that began last night.  Has taken tylenol and used ice packs without relief.  Had teeth cleaned two days ago and plans for dental extraction of this tooth in 5 days.  Denies fevers, difficulty swallowing or breathing.     She is currently pregnant.  Has had no complications.  Is feeling normal fetal movement.  Denies abdominal pain, abnormal vaginal discharge or bleeding.     Past Medical History  Diagnosis Date  . Muscular dystrophy [facility])    Past Surgical History  Procedure Laterality Date  . External ear surgery     Family History  Problem Relation Age of Onset  . Charcot-Marie-Tooth disease Father   . Charcot-Marie-Tooth disease Brother     paternal half-brother  . Charcot-Marie-Tooth disease Paternal Uncle   . Charcot-Marie-Tooth disease Paternal Grandmother   . Charcot-Marie-Tooth disease Cousin    Social History  Substance Use Topics  . Smoking status: Former Smoker -- 0.00 packs/day  . Smokeless tobacco: None  . Alcohol Use: No   OB History    Gravida Para Term Preterm AB TAB SAB Ectopic Multiple Living   1              Review of Systems  Constitutional: Negative for fever and chills.  HENT: Positive for dental problem, facial swelling and sore throat. Negative for trouble swallowing.   Respiratory: Negative for shortness of breath.   Musculoskeletal: Negative for myalgias.  Skin: Negative for color change.  Psychiatric/Behavioral: Negative for self-injury.      Allergies  Peanut-containing drug products  Home Medications   Prior to Admission medications   Medication Sig Start Date End Date Taking? Authorizing Provider   Prenatal Vit-Fe Fumarate-FA (PRENATAL COMPLETE) 14-0.4 MG TABS Take 2 tablets by mouth daily. 09/12/15   Hannah Muthersbaugh, PA-C   BP 129/73 mmHg  Pulse 89  Temp(Src) 98.9 F (37.2 C) (Oral)  Resp 18  Ht  (1.651 m)  Wt 79.379 kg  BMI 29.12 kg/m2  SpO2 100%  LMP 07/14/2015 Physical Exam  Constitutional: She appears well-developed and well-nourished. No distress.  HENT:  Head: Normocephalic and atraumatic.  Mouth/Throat: Uvula is midline and oropharynx is clear and moist. Mucous membranes are not dry. No uvula swelling. No oropharyngeal exudate, posterior oropharyngeal edema, posterior oropharyngeal erythema or tonsillar abscesses.  Left lower thrid molar decayed with edematous gingiva surrounding, tender to palpation.  Associated left facial swelling.  No trismus or voice change.    Neck: Trachea normal, normal range of motion and phonation normal. Neck supple. No tracheal tenderness present. No rigidity. No tracheal deviation, no edema, no erythema and normal range of motion present.  Cardiovascular: Normal rate.   Pulmonary/Chest: Effort normal and breath sounds normal. No stridor.  Lymphadenopathy:    She has no cervical adenopathy.  Neurological: She is alert.  Skin: She is not diaphoretic.  Nursing note and vitals reviewed.   ED Course  Procedures (including critical care time) Labs Review Labs Reviewed - No data to display  Imaging Review No results found. I have personally reviewed and evaluated these images and lab results as part of my medical decision-making.   EKG Interpretation None  MDM   Final diagnoses:  Dental abscess   Afebrile, nontoxic patient with new dental pain with obvious abscess. No airway concerns. Doubt Ludwig's angina.  D/C home with antibiotic, and dental follow up.  Pt has dentist, advised to call first thing in the morning.  Discussed findings, treatment, and follow up  with patient.  Pt given return precautions.  Pt verbalizes  understanding and agrees with plan.        HallamEmily Chelly Dombeck, PA-C 01/10/16 [id number]  Doug SouSam Jacubowitz, MD 01/10/16 [phone]

## 2016-01-09 NOTE — ED Notes (Signed)
FHT 150

## 2016-01-09 NOTE — Discharge Instructions (Signed)
Read the information below.  Use the prescribed medication as directed.  Please discuss all new medications with your pharmacist.  You may return to the Emergency Department at any time for worsening condition or any new symptoms that concern you.  Please call your dentist first thing tomorrow morning to discuss your infection and establish close follow up appointment.  If you develop fevers, worsening swelling in your face, difficulty swallowing or breathing, return to the ER immediately for a recheck.     Dental Abscess A dental abscess is a collection of pus in or around a tooth. CAUSES This condition is caused by a bacterial infection around the root of the tooth that involves the inner part of the tooth (pulp). It may result from:  Severe tooth decay.  Trauma to the tooth that allows bacteria to enter into the pulp, such as a broken or chipped tooth.  Severe gum disease around a tooth. SYMPTOMS Symptoms of this condition include:  Severe pain in and around the infected tooth.  Swelling and redness around the infected tooth, in the mouth, or in the face.  Tenderness.  Pus drainage.  Bad breath.  Bitter taste in the mouth.  Difficulty swallowing.  Difficulty opening the mouth.  Nausea.  Vomiting.  Chills.  Swollen neck glands.  Fever. DIAGNOSIS This condition is diagnosed with examination of the infected tooth. During the exam, your dentist may tap on the infected tooth. Your dentist will also ask about your medical and dental history and may order X-rays. TREATMENT This condition is treated by eliminating the infection. This may be done with:  Antibiotic medicine.  A root canal. This may be performed to save the tooth.  Pulling (extracting) the tooth. This may also involve draining the abscess. This is done if the tooth cannot be saved. HOME CARE INSTRUCTIONS  Take medicines only as directed by your dentist.  If you were prescribed antibiotic medicine,  finish all of it even if you start to feel better.  Rinse your mouth (gargle) often with salt water to relieve pain or swelling.  Do not drive or operate heavy machinery while taking pain medicine.  Do not apply heat to the outside of your mouth.  Keep all follow-up visits as directed by your dentist. This is important. SEEK MEDICAL CARE IF:  Your pain is worse and is not helped by medicine. SEEK IMMEDIATE MEDICAL CARE IF:  You have a fever or chills.  Your symptoms suddenly get worse.  You have a very bad headache.  You have problems breathing or swallowing.  You have trouble opening your mouth.  You have swelling in your neck or around your eye.   This information is not intended to replace advice given to you by your health care provider. Make sure you discuss any questions you have with your health care provider.   Document Released: 09/29/2005 Document Revised: 02/13/2015 Document Reviewed: 09/26/2014 Elsevier Interactive Patient Education [employer]2016 [employer] Inc.

## 2016-01-09 NOTE — ED Notes (Signed)
MD at bedside. 

## 2016-02-01 ENCOUNTER — Ambulatory Visit (HOSPITAL_COMMUNITY)
Admission: RE | Admit: 2016-02-01 | Discharge: 2016-02-01 | Disposition: A | Discharge status: Home / Self Care | Payer: Medicaid Other | Source: Ambulatory Visit | Attending: Maternal and Fetal Medicine | Admitting: Maternal and Fetal Medicine

## 2016-02-01 ENCOUNTER — Encounter (HOSPITAL_COMMUNITY): Payer: Self-pay

## 2016-02-01 ENCOUNTER — Other Ambulatory Visit (HOSPITAL_COMMUNITY): Payer: Self-pay | Admitting: Maternal and Fetal Medicine

## 2016-02-01 VITALS — BP 128/53 | HR 73 | Wt 185.4 lb

## 2016-02-01 DIAGNOSIS — O359XX0 Maternal care for (suspected) fetal abnormality and damage, unspecified, not applicable or unspecified: Secondary | ICD-10-CM | POA: Diagnosis not present

## 2016-02-01 DIAGNOSIS — O280 Abnormal hematological finding on antenatal screening of mother: Secondary | ICD-10-CM

## 2016-02-01 DIAGNOSIS — Z3A26 26 weeks gestation of pregnancy: Secondary | ICD-10-CM | POA: Insufficient documentation

## 2016-02-01 DIAGNOSIS — O352XX0 Maternal care for (suspected) hereditary disease in fetus, not applicable or unspecified: Secondary | ICD-10-CM

## 2016-02-01 DIAGNOSIS — O99332 Smoking (tobacco) complicating pregnancy, second trimester: Secondary | ICD-10-CM

## 2016-02-01 DIAGNOSIS — O281 Abnormal biochemical finding on antenatal screening of mother: Secondary | ICD-10-CM | POA: Insufficient documentation

## 2016-02-01 DIAGNOSIS — Z820 Family history of epilepsy and other diseases of the nervous system: Secondary | ICD-10-CM

## 2016-02-04 ENCOUNTER — Other Ambulatory Visit (HOSPITAL_COMMUNITY): Payer: Self-pay | Admitting: *Deleted

## 2016-02-04 DIAGNOSIS — O280 Abnormal hematological finding on antenatal screening of mother: Secondary | ICD-10-CM

## 2016-03-14 ENCOUNTER — Ambulatory Visit (HOSPITAL_COMMUNITY)
Admission: RE | Admit: 2016-03-14 | Discharge: 2016-03-14 | Disposition: A | Discharge status: Home / Self Care | Payer: Medicaid Other | Source: Ambulatory Visit | Attending: Obstetrics and Gynecology | Admitting: Obstetrics and Gynecology

## 2016-03-14 ENCOUNTER — Encounter (HOSPITAL_COMMUNITY): Payer: Self-pay

## 2016-03-14 ENCOUNTER — Other Ambulatory Visit (HOSPITAL_COMMUNITY): Payer: Self-pay | Admitting: Maternal and Fetal Medicine

## 2016-03-14 VITALS — BP 124/71 | HR 64 | Wt 216.0 lb

## 2016-03-14 DIAGNOSIS — Z3A32 32 weeks gestation of pregnancy: Secondary | ICD-10-CM

## 2016-03-14 DIAGNOSIS — G600 Hereditary motor and sensory neuropathy: Secondary | ICD-10-CM

## 2016-03-14 DIAGNOSIS — O283 Abnormal ultrasonic finding on antenatal screening of mother: Secondary | ICD-10-CM

## 2016-03-14 DIAGNOSIS — O280 Abnormal hematological finding on antenatal screening of mother: Secondary | ICD-10-CM

## 2016-03-14 DIAGNOSIS — O99333 Smoking (tobacco) complicating pregnancy, third trimester: Secondary | ICD-10-CM | POA: Insufficient documentation

## 2016-03-14 DIAGNOSIS — O289 Unspecified abnormal findings on antenatal screening of mother: Secondary | ICD-10-CM | POA: Diagnosis present

## 2016-03-14 DIAGNOSIS — O352XX0 Maternal care for (suspected) hereditary disease in fetus, not applicable or unspecified: Secondary | ICD-10-CM

## 2016-03-14 DIAGNOSIS — Z820 Family history of epilepsy and other diseases of the nervous system: Secondary | ICD-10-CM

## 2016-04-11 ENCOUNTER — Ambulatory Visit (HOSPITAL_COMMUNITY): Payer: Medicaid Other

## 2016-09-03 ENCOUNTER — Encounter (HOSPITAL_COMMUNITY): Payer: Self-pay

## 2017-10-02 ENCOUNTER — Other Ambulatory Visit: Payer: Self-pay

## 2017-10-02 ENCOUNTER — Encounter (HOSPITAL_BASED_OUTPATIENT_CLINIC_OR_DEPARTMENT_OTHER): Payer: Self-pay | Admitting: Emergency Medicine

## 2017-10-02 DIAGNOSIS — Z87891 Personal history of nicotine dependence: Secondary | ICD-10-CM | POA: Insufficient documentation

## 2017-10-02 DIAGNOSIS — G7100 Muscular dystrophy, unspecified: Secondary | ICD-10-CM | POA: Insufficient documentation

## 2017-10-02 DIAGNOSIS — M25562 Pain in left knee: Secondary | ICD-10-CM | POA: Insufficient documentation

## 2017-10-02 DIAGNOSIS — Z23 Encounter for immunization: Secondary | ICD-10-CM | POA: Insufficient documentation

## 2017-10-02 NOTE — ED Triage Notes (Addendum)
PT presents with c/o left knee pain for 2 days after fall. Small abrasion to left knee.

## 2017-10-03 ENCOUNTER — Emergency Department (HOSPITAL_BASED_OUTPATIENT_CLINIC_OR_DEPARTMENT_OTHER)
Admission: EM | Admit: 2017-10-03 | Discharge: 2017-10-03 | Disposition: A | Discharge status: Home / Self Care | Payer: Self-pay | Attending: Emergency Medicine | Admitting: Emergency Medicine

## 2017-10-03 ENCOUNTER — Emergency Department (HOSPITAL_BASED_OUTPATIENT_CLINIC_OR_DEPARTMENT_OTHER): Payer: Self-pay

## 2017-10-03 DIAGNOSIS — M25562 Pain in left knee: Secondary | ICD-10-CM

## 2017-10-03 LAB — PREGNANCY, URINE: Preg Test, Ur: NEGATIVE

## 2017-10-03 MED ORDER — TETANUS-DIPHTH-ACELL PERTUSSIS 5-2.5-18.5 LF-MCG/0.5 IM SUSP
0.5000 mL | Freq: Once | INTRAMUSCULAR | Status: AC
  Administered 2017-10-03: 0.5 mL via INTRAMUSCULAR
  Filled 2017-10-03: qty 0.5

## 2017-10-03 MED ORDER — IBUPROFEN 400 MG PO TABS
400.0000 mg | ORAL_TABLET | Freq: Once | ORAL | Status: AC | PRN
  Administered 2017-10-03: 400 mg via ORAL

## 2017-10-03 MED ORDER — NAPROXEN 250 MG PO TABS
500.0000 mg | ORAL_TABLET | Freq: Once | ORAL | Status: AC
  Administered 2017-10-03: 500 mg via ORAL
  Filled 2017-10-03: qty 2

## 2017-10-03 MED ORDER — IBUPROFEN 400 MG PO TABS
ORAL_TABLET | ORAL | Status: AC
  Filled 2017-10-03: qty 1

## 2017-10-03 MED ORDER — DICLOFENAC SODIUM ER 100 MG PO TB24: 100.0000 mg | ORAL_TABLET | Freq: Every day | ORAL | 0 refills | Status: DC

## 2017-10-03 MED ORDER — ACETAMINOPHEN 500 MG PO TABS
1000.0000 mg | ORAL_TABLET | Freq: Once | ORAL | Status: AC
  Administered 2017-10-03: 1000 mg via ORAL
  Filled 2017-10-03: qty 2

## 2017-10-03 MED ORDER — MUPIROCIN CALCIUM 2 % EX CREA: 1.0000 "application " | TOPICAL_CREAM | Freq: Two times a day (BID) | CUTANEOUS | 0 refills | Status: DC

## 2017-10-03 NOTE — ED Notes (Signed)
Pt requesting something stronger for pain and sleeping pills. PT told she would have to see a PCP for chronic issues.

## 2017-10-03 NOTE — ED Provider Notes (Addendum)
[facility] EMERGENCY DEPARTMENT Provider Note   CSN: [id number] Arrival date & time: 10/02/17  2330     History   Chief Complaint Chief Complaint  Patient presents with  . Knee Pain    HPI Christina Owens is a 26 y.o. female.  The history is provided by the patient.  Knee Pain   This is a new problem. The current episode started yesterday (more than 24 hours ). The problem occurs constantly. The problem has not changed since onset.The pain is present in the left knee. The quality of the pain is described as aching. The pain is at a severity of 10/10. The pain is severe. Pertinent negatives include no numbness. She has tried nothing for the symptoms. The treatment provided no relief. History of extremity trauma: knee onto the knee on the porch  Family history is significant for no rheumatoid arthritis.    Past Medical History:  Diagnosis Date  . Muscular dystrophy     Patient Active Problem List   Diagnosis Date Noted  . Abnormal MSAFP (maternal serum alpha-fetoprotein), elevated 11/30/2015  . Family history of Charcot-Marie-Tooth disease 11/30/2015  . Hereditary disease in family possibly affecting fetus, affecting management of mother, antepartum condition or complication 11/30/2015    Past Surgical History:  Procedure Laterality Date  . EXTERNAL EAR SURGERY      OB History    Gravida Para Term Preterm AB Living   1             SAB TAB Ectopic Multiple Live Births                   Home Medications    Prior to Admission medications   Medication Sig Start Date End Date Taking? Authorizing Provider  Diclofenac Sodium CR (VOLTAREN-XR) 100 MG 24 hr tablet Take 1 tablet (100 mg total) by mouth daily. 10/03/17   Santiana Glidden, MD  mupirocin cream (BACTROBAN) 2 % Apply 1 application topically 2 (two) times daily. 10/03/17   Dalyn Kjos, MD  Prenatal Vit-Fe Fumarate-FA (PRENATAL COMPLETE) 14-0.4 MG TABS Take 2 tablets by mouth daily. 09/12/15    Muthersbaugh, Dahlia Client, PA-C    Family History Family History  Problem Relation Age of Onset  . Charcot-Marie-Tooth disease Paternal Grandmother   . Charcot-Marie-Tooth disease Father   . Charcot-Marie-Tooth disease Brother        paternal half-brother  . Charcot-Marie-Tooth disease Paternal Uncle   . Charcot-Marie-Tooth disease Cousin     Social History Social History   Tobacco Use  . Smoking status: Former Smoker    Packs/day: 0.00  . Smokeless tobacco: Never Used  Substance Use Topics  . Alcohol use: No  . Drug use: No     Allergies   Peanut-containing drug products   Review of Systems Review of Systems  Cardiovascular: Negative for chest pain.  Musculoskeletal: Positive for arthralgias.  Neurological: Negative for syncope, speech difficulty and numbness.  All other systems reviewed and are negative.    Physical Exam Updated Vital Signs BP 137/83 (BP Location: Left Arm)   Pulse 85   Temp 98.7 F (37.1 C) (Oral)   Resp 18   Ht 5\' 3"  (1.6 m)   Wt 74.8 kg (165 lb)   LMP 09/26/2017   SpO2 100%   BMI 29.23 kg/m   Physical Exam  Constitutional: She is oriented to person, place, and time. She appears well-developed and well-nourished.  HENT:  Head: Normocephalic.  Eyes: Conjunctivae and EOM are normal.  Neck: Normal range of motion. Neck supple.  Cardiovascular: Normal rate, regular rhythm, normal heart sounds and intact distal pulses.  Pulmonary/Chest: Effort normal and breath sounds normal. No stridor.  Abdominal: Soft. Bowel sounds are normal. She exhibits no mass. There is no tenderness.  Musculoskeletal: Normal range of motion.       Right knee: Normal.       Left knee: She exhibits normal range of motion, no swelling, no effusion, no ecchymosis, no deformity, no erythema, normal alignment, no LCL laxity, normal patellar mobility, no bony tenderness, normal meniscus and no MCL laxity. No tenderness found. No medial joint line, no lateral joint line, no  MCL, no LCL and no patellar tendon tenderness noted.       Left ankle: Normal. Achilles tendon normal.       Left lower leg: Normal.       Legs: Neurological: She is alert and oriented to person, place, and time. She displays normal reflexes. She exhibits normal muscle tone.  Skin: Skin is warm and dry. Capillary refill takes less than 2 seconds.  Psychiatric: Her affect is angry.     ED Treatments / Results  Labs (all labs ordered are listed, but only abnormal results are displayed) Labs Reviewed  PREGNANCY, URINE    EKG  EKG Interpretation None       Radiology Dg Knee Complete 4 Views Left  Result Date: 10/03/2017 CLINICAL DATA:  Status post fall while going up porch steps, with left knee pain. Initial encounter. EXAM: LEFT KNEE - COMPLETE 4+ VIEW COMPARISON:  Left knee radiographs performed 07/10/2008 FINDINGS: There is no evidence of fracture or dislocation. The joint spaces are preserved. No significant degenerative change is seen; the patellofemoral joint is grossly unremarkable in appearance. No significant joint effusion is seen. The visualized soft tissues are normal in appearance. IMPRESSION: No evidence of fracture or dislocation. Electronically Signed   By: Roanna RaiderJeffery  Chang M.D.   On: 10/03/2017 02:03    Procedures Procedures (including critical care time)  Medications Ordered in ED Medications  ibuprofen (ADVIL,MOTRIN) tablet 400 mg (400 mg Oral Given 10/03/17 0052)  naproxen (NAPROSYN) tablet 500 mg (500 mg Oral Given 10/03/17 0221)  acetaminophen (TYLENOL) tablet 1,000 mg (1,000 mg Oral Given 10/03/17 0221)  Tdap (BOOSTRIX) injection 0.5 mL (0.5 mLs Intramuscular Given 10/03/17 0221)     Angry and rude to staff during the course of her stay in the ED.    Demanding stronger medication wants narcotics.    Wound cleansed and bandaged   Final Clinical Impressions(s) / ED Diagnoses   Final diagnoses:  Acute pain of left knee   The knee wound is  superficial and is greater than 24 hours old.  We have done extensive wound care applied bactracin.   Return for fevers > 100.4 unrelieved by medication, drainage from the right eye, weakness numbness double vision changes in color vision changes in speech, ascending weakness or numbness, stiff neck, intractable vomiting, or diarrhea, abdominal pain, Inability to tolerate liquids or food, cough, altered mental status or any concerns. No signs of systemic illness or infection. The patient is nontoxic-appearing on exam and vital signs are within normal limits.    I have reviewed the triage vital signs and the nursing notes. Pertinent labs &imaging results that were available during my care of the patient were reviewed by me and considered in my medical decision making (see chart for details).  After history, exam, and medical workup I feel the patient has  been appropriately medically screened and is safe for discharge home. Pertinent diagnoses were discussed with the patient. Patient was given return precautions        ED Discharge Orders        Ordered    mupirocin cream (BACTROBAN) 2 %  2 times daily     10/03/17 0226    Diclofenac Sodium CR (VOLTAREN-XR) 100 MG 24 hr tablet  Daily     10/03/17 0226       Anesha Hackert, MD 10/03/17 [id number]    Nicanor AlconPalumbo, Tychelle Purkey, MD 10/03/17 [id number]

## 2017-10-03 NOTE — ED Notes (Signed)
PT discharged to home with family. NAD. 

## 2020-04-05 ENCOUNTER — Other Ambulatory Visit: Payer: Self-pay

## 2020-04-05 ENCOUNTER — Encounter (HOSPITAL_BASED_OUTPATIENT_CLINIC_OR_DEPARTMENT_OTHER): Payer: Self-pay | Admitting: *Deleted

## 2020-04-05 DIAGNOSIS — Z91018 Allergy to other foods: Secondary | ICD-10-CM | POA: Diagnosis not present

## 2020-04-05 DIAGNOSIS — M7918 Myalgia, other site: Secondary | ICD-10-CM | POA: Diagnosis not present

## 2020-04-05 DIAGNOSIS — Y929 Unspecified place or not applicable: Secondary | ICD-10-CM | POA: Diagnosis not present

## 2020-04-05 DIAGNOSIS — Y999 Unspecified external cause status: Secondary | ICD-10-CM | POA: Diagnosis not present

## 2020-04-05 DIAGNOSIS — Z87891 Personal history of nicotine dependence: Secondary | ICD-10-CM | POA: Diagnosis not present

## 2020-04-05 DIAGNOSIS — Y93I9 Activity, other involving external motion: Secondary | ICD-10-CM | POA: Diagnosis not present

## 2020-04-05 DIAGNOSIS — S199XXA Unspecified injury of neck, initial encounter: Secondary | ICD-10-CM | POA: Diagnosis present

## 2020-04-05 DIAGNOSIS — S161XXA Strain of muscle, fascia and tendon at neck level, initial encounter: Secondary | ICD-10-CM | POA: Diagnosis not present
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
## 2020-04-06 ENCOUNTER — Encounter (HOSPITAL_BASED_OUTPATIENT_CLINIC_OR_DEPARTMENT_OTHER): Payer: Self-pay | Admitting: Emergency Medicine

## 2020-04-06 ENCOUNTER — Emergency Department (HOSPITAL_BASED_OUTPATIENT_CLINIC_OR_DEPARTMENT_OTHER)
Admission: EM | Admit: 2020-04-06 | Discharge: 2020-04-06 | Disposition: A | Discharge status: Home / Self Care | Payer: Self-pay | Attending: Emergency Medicine | Admitting: Emergency Medicine

## 2020-04-06 DIAGNOSIS — M7918 Myalgia, other site: Secondary | ICD-10-CM

## 2020-04-06 DIAGNOSIS — S161XXA Strain of muscle, fascia and tendon at neck level, initial encounter: Secondary | ICD-10-CM

## 2020-04-06 HISTORY — DX: Hereditary motor and sensory neuropathy: G60.0

## 2020-04-06 MED ORDER — NAPROXEN 375 MG PO TABS: ORAL_TABLET | ORAL | 0 refills | Status: DC

## 2020-04-06 MED ORDER — NAPROXEN 250 MG PO TABS
500.0000 mg | ORAL_TABLET | Freq: Once | ORAL | Status: AC
  Administered 2020-04-06: 500 mg via ORAL
  Filled 2020-04-06: qty 2

## 2020-04-06 MED ORDER — CYCLOBENZAPRINE HCL 10 MG PO TABS
10.0000 mg | ORAL_TABLET | Freq: Once | ORAL | Status: AC
  Administered 2020-04-06: 10 mg via ORAL
  Filled 2020-04-06: qty 1

## 2020-04-06 MED ORDER — CYCLOBENZAPRINE HCL 10 MG PO TABS: 10.0000 mg | ORAL_TABLET | Freq: Three times a day (TID) | ORAL | 0 refills | Status: DC | PRN
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
## 2020-06-11 ENCOUNTER — Encounter (HOSPITAL_BASED_OUTPATIENT_CLINIC_OR_DEPARTMENT_OTHER): Payer: Self-pay | Admitting: *Deleted

## 2020-06-11 ENCOUNTER — Other Ambulatory Visit: Payer: Self-pay

## 2020-06-11 DIAGNOSIS — N898 Other specified noninflammatory disorders of vagina: Secondary | ICD-10-CM | POA: Insufficient documentation

## 2020-06-11 DIAGNOSIS — R102 Pelvic and perineal pain: Secondary | ICD-10-CM | POA: Insufficient documentation

## 2020-06-11 DIAGNOSIS — Z20822 Contact with and (suspected) exposure to covid-19: Secondary | ICD-10-CM | POA: Insufficient documentation

## 2020-06-11 LAB — URINALYSIS, MICROSCOPIC (REFLEX)

## 2020-06-11 LAB — SARS CORONAVIRUS 2 BY RT PCR (HOSPITAL ORDER, PERFORMED IN ~~LOC~~ HOSPITAL LAB): SARS Coronavirus 2: NEGATIVE

## 2020-06-11 LAB — URINALYSIS, ROUTINE W REFLEX MICROSCOPIC
Bilirubin Urine: NEGATIVE
Glucose, UA: NEGATIVE mg/dL
Hgb urine dipstick: NEGATIVE
Ketones, ur: 15 mg/dL — AB
Nitrite: NEGATIVE
Protein, ur: NEGATIVE mg/dL
Specific Gravity, Urine: >1.03 — ABNORMAL HIGH (ref 1.005–1.030)
pH: 5.5 (ref 5.0–8.0)

## 2020-06-11 LAB — PREGNANCY, URINE: Preg Test, Ur: NEGATIVE

## 2020-06-11 MED ORDER — ACETAMINOPHEN 325 MG PO TABS
650.0000 mg | ORAL_TABLET | Freq: Once | ORAL | Status: AC
  Administered 2020-06-11: 650 mg via ORAL

## 2020-06-11 MED ORDER — ACETAMINOPHEN 325 MG PO TABS
ORAL_TABLET | ORAL | Status: AC
  Filled 2020-06-11: qty 2

## 2020-06-11 NOTE — ED Triage Notes (Addendum)
Pt c/o vaginal discharge x 2 days  C/o abd pain and cough , fever

## 2020-06-12 ENCOUNTER — Encounter (HOSPITAL_BASED_OUTPATIENT_CLINIC_OR_DEPARTMENT_OTHER): Payer: Self-pay | Admitting: Emergency Medicine

## 2020-06-12 ENCOUNTER — Emergency Department (HOSPITAL_BASED_OUTPATIENT_CLINIC_OR_DEPARTMENT_OTHER)
Admission: EM | Admit: 2020-06-12 | Discharge: 2020-06-12 | Disposition: A | Discharge status: Home / Self Care | Payer: Self-pay | Attending: Emergency Medicine | Admitting: Emergency Medicine

## 2020-06-12 DIAGNOSIS — N730 Acute parametritis and pelvic cellulitis: Secondary | ICD-10-CM

## 2020-06-12 LAB — WET PREP, GENITAL
Sperm: NONE SEEN
Trich, Wet Prep: NONE SEEN
Yeast Wet Prep HPF POC: NONE SEEN

## 2020-06-12 MED ORDER — AZITHROMYCIN 1 G PO PACK
1.0000 g | PACK | Freq: Once | ORAL | Status: AC
  Administered 2020-06-12: 1 g via ORAL
  Filled 2020-06-12: qty 1

## 2020-06-12 MED ORDER — DOXYCYCLINE HYCLATE 100 MG PO CAPS: 100.0000 mg | ORAL_CAPSULE | Freq: Two times a day (BID) | ORAL | 0 refills | Status: DC

## 2020-06-12 MED ORDER — CEFTRIAXONE SODIUM 500 MG IJ SOLR
500.0000 mg | Freq: Once | INTRAMUSCULAR | Status: AC
  Administered 2020-06-12: 500 mg via INTRAMUSCULAR
  Filled 2020-06-12: qty 500
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
## 2020-06-13 LAB — GC/CHLAMYDIA PROBE AMP (~~LOC~~) NOT AT ARMC
Chlamydia: NEGATIVE
Comment: NEGATIVE
Comment: NORMAL
Neisseria Gonorrhea: POSITIVE — AB

## 2021-05-30 ENCOUNTER — Encounter (HOSPITAL_COMMUNITY): Payer: Self-pay

## 2021-05-30 ENCOUNTER — Emergency Department (HOSPITAL_COMMUNITY): Payer: Self-pay

## 2021-05-30 ENCOUNTER — Emergency Department (HOSPITAL_COMMUNITY)
Admission: EM | Admit: 2021-05-30 | Discharge: 2021-05-30 | Disposition: A | Discharge status: Home / Self Care | Payer: Self-pay | Attending: Emergency Medicine | Admitting: Emergency Medicine

## 2021-05-30 ENCOUNTER — Other Ambulatory Visit: Payer: Self-pay

## 2021-05-30 DIAGNOSIS — K5900 Constipation, unspecified: Secondary | ICD-10-CM | POA: Insufficient documentation

## 2021-05-30 DIAGNOSIS — K644 Residual hemorrhoidal skin tags: Secondary | ICD-10-CM | POA: Insufficient documentation

## 2021-05-30 DIAGNOSIS — Y658 Other specified misadventures during surgical and medical care: Secondary | ICD-10-CM | POA: Insufficient documentation

## 2021-05-30 DIAGNOSIS — R21 Rash and other nonspecific skin eruption: Secondary | ICD-10-CM | POA: Insufficient documentation

## 2021-05-30 DIAGNOSIS — B9689 Other specified bacterial agents as the cause of diseases classified elsewhere: Secondary | ICD-10-CM

## 2021-05-30 DIAGNOSIS — Z91010 Allergy to peanuts: Secondary | ICD-10-CM | POA: Insufficient documentation

## 2021-05-30 DIAGNOSIS — N760 Acute vaginitis: Secondary | ICD-10-CM | POA: Insufficient documentation

## 2021-05-30 DIAGNOSIS — R1030 Lower abdominal pain, unspecified: Secondary | ICD-10-CM | POA: Insufficient documentation

## 2021-05-30 DIAGNOSIS — Z87891 Personal history of nicotine dependence: Secondary | ICD-10-CM | POA: Insufficient documentation

## 2021-05-30 DIAGNOSIS — T8332XA Displacement of intrauterine contraceptive device, initial encounter: Secondary | ICD-10-CM | POA: Insufficient documentation

## 2021-05-30 LAB — URINALYSIS, ROUTINE W REFLEX MICROSCOPIC
Bacteria, UA: NONE SEEN
Bilirubin Urine: NEGATIVE
Glucose, UA: NEGATIVE mg/dL
Hgb urine dipstick: NEGATIVE
Ketones, ur: NEGATIVE mg/dL
Nitrite: NEGATIVE
Protein, ur: NEGATIVE mg/dL
Specific Gravity, Urine: 1.026 (ref 1.005–1.030)
pH: 6 (ref 5.0–8.0)

## 2021-05-30 LAB — BASIC METABOLIC PANEL
Anion gap: 8 (ref 5–15)
BUN: 12 mg/dL (ref 6–20)
CO2: 22 mmol/L (ref 22–32)
Calcium: 9.5 mg/dL (ref 8.9–10.3)
Chloride: 112 mmol/L — ABNORMAL HIGH (ref 98–111)
Creatinine, Ser: 0.74 mg/dL (ref 0.44–1.00)
GFR, Estimated: >60 mL/min (ref >60)
Glucose, Bld: 93 mg/dL (ref 70–99)
Potassium: 3.7 mmol/L (ref 3.5–5.1)
Sodium: 142 mmol/L (ref 135–145)

## 2021-05-30 LAB — CBC WITH DIFFERENTIAL/PLATELET
Abs Immature Granulocytes: 0.02 10*3/uL (ref 0.00–0.07)
Basophils Absolute: 0 10*3/uL (ref 0.0–0.1)
Basophils Relative: 0 %
Eosinophils Absolute: 0.1 10*3/uL (ref 0.0–0.5)
Eosinophils Relative: 1 %
HCT: 41.5 % (ref 36.0–46.0)
Hemoglobin: 13.8 g/dL (ref 12.0–15.0)
Immature Granulocytes: 0 %
Lymphocytes Relative: 36 %
Lymphs Abs: 3.3 10*3/uL (ref 0.7–4.0)
MCH: 29.2 pg (ref 26.0–34.0)
MCHC: 33.3 g/dL (ref 30.0–36.0)
MCV: 87.9 fL (ref 80.0–100.0)
Monocytes Absolute: 0.6 10*3/uL (ref 0.1–1.0)
Monocytes Relative: 6 %
Neutro Abs: 5.1 10*3/uL (ref 1.7–7.7)
Neutrophils Relative %: 57 %
Platelets: 299 10*3/uL (ref 150–400)
RBC: 4.72 MIL/uL (ref 3.87–5.11)
RDW: 14.3 % (ref 11.5–15.5)
WBC: 9.2 10*3/uL (ref 4.0–10.5)
nRBC: 0 % (ref 0.0–0.2)

## 2021-05-30 LAB — I-STAT BETA HCG BLOOD, ED (MC, WL, AP ONLY)
I-stat hCG, quantitative: <5 m[IU]/mL (ref <5)
I-stat hCG, quantitative: <5 m[IU]/mL (ref <5)

## 2021-05-30 LAB — WET PREP, GENITAL
Sperm: NONE SEEN
Trich, Wet Prep: NONE SEEN
Yeast Wet Prep HPF POC: NONE SEEN

## 2021-05-30 LAB — HIV ANTIBODY (ROUTINE TESTING W REFLEX): HIV Screen 4th Generation wRfx: NONREACTIVE

## 2021-05-30 MED ORDER — IOHEXOL 350 MG/ML SOLN
100.0000 mL | Freq: Once | INTRAVENOUS | Status: AC | PRN
  Administered 2021-05-30: 80 mL via INTRAVENOUS

## 2021-05-30 MED ORDER — METRONIDAZOLE 500 MG PO TABS: 500.0000 mg | ORAL_TABLET | Freq: Two times a day (BID) | ORAL | 0 refills | Status: AC

## 2021-05-30 MED ORDER — SENNOSIDES-DOCUSATE SODIUM 8.6-50 MG PO TABS: 1.0000 | ORAL_TABLET | Freq: Every day | ORAL | 0 refills | Status: AC

## 2021-05-30 MED ORDER — TRIAMCINOLONE ACETONIDE 0.1 % EX CREA: 1.0000 "application " | TOPICAL_CREAM | Freq: Two times a day (BID) | CUTANEOUS | 0 refills | Status: AC

## 2021-05-30 NOTE — ED Provider Notes (Signed)
[facility]-EMERGENCY DEPT Provider Note   CSN: [id number] Arrival date & time: 05/30/21  1054     History Chief Complaint  Patient presents with   Abdominal Pain   Rash    Christina Owens is a 30 y.o. female.   Abdominal Pain Associated symptoms: constipation and vaginal discharge   Associated symptoms: no chest pain, no shortness of breath and no vomiting   Rash Associated symptoms: abdominal pain   Associated symptoms: no shortness of breath and not vomiting   Patient presents with some lower abdominal pain/rectal pain.  States she is having difficulty having a bowel movement.  States that she is trying to go and feels that she cannot really.  Some lower abdominal crampiness.  States she has taken some MiraLAX and only resulted in a little soft stool coming out.  No fevers. Patient also has some vaginal discharge.  Mild.  States that she is previous had gonorrhea about a year ago.  No real pelvic pain. Patient also has a history of hemorrhoids.  States that since the straining for had try and have a bowel movement her hemorrhoids have gotten larger and has had some bleeding now.    Past Medical History:  Diagnosis Date   Charcot-Marie disease     Patient Active Problem List   Diagnosis Date Noted   Abnormal MSAFP (maternal serum alpha-fetoprotein), elevated 11/30/2015   Family history of Charcot-Marie-Tooth disease 11/30/2015   Hereditary disease in family possibly affecting fetus, affecting management of mother, antepartum condition or complication 11/30/2015    Past Surgical History:  Procedure Laterality Date   EXTERNAL EAR SURGERY       OB History     Gravida  1   Para      Term      Preterm      AB      Living         SAB      IAB      Ectopic      Multiple      Live Births              Family History  Problem Relation Age of Onset   Charcot-Marie-Tooth disease Father    Charcot-Marie-Tooth disease Brother         paternal half-brother   Charcot-Marie-Tooth disease Paternal Grandmother    Charcot-Marie-Tooth disease Paternal Uncle    Charcot-Marie-Tooth disease Cousin     Social History   Tobacco Use   Smoking status: Former    Packs/day: 0.00    Types: Cigarettes   Smokeless tobacco: Never  Vaping Use   Vaping Use: Never used  Substance Use Topics   Alcohol use: No   Drug use: No    Home Medications Prior to Admission medications   Medication Sig Start Date End Date Taking? Authorizing Provider  acetaminophen (TYLENOL) 500 MG tablet Take 500 mg by mouth every 6 (six) hours as needed for mild pain, fever or headache.   Yes [provider]  hydrocortisone (ANUSOL-HC) 2.5 % rectal cream Place 1 application rectally 4 (four) times daily as needed for hemorrhoids or anal itching.   Yes [provider]  hydrocortisone cream 1 % Apply 1 application topically 4 (four) times daily as needed for itching.   Yes [provider]  metroNIDAZOLE (FLAGYL) 500 MG tablet Take 1 tablet (500 mg total) by mouth 2 (two) times daily for 7 days. 05/30/21 06/06/21 Yes Sloan LeiterGray, Samuel A, DO  senna-docusate (SENOKOT-S) 8.6-50 MG tablet Take 1 tablet by mouth daily for 14 days. 05/30/21 06/13/21 Yes Tanda Rockers A, DO  triamcinolone cream (KENALOG) 0.1 % Apply 1 application topically 2 (two) times daily for 7 days. 05/30/21 06/06/21 Yes Benjiman Core, MD    Allergies    Peanut-containing drug products  Review of Systems   Review of Systems  Constitutional:  Negative for appetite change.  HENT:  Negative for congestion.   Respiratory:  Negative for shortness of breath.   Cardiovascular:  Negative for chest pain.  Gastrointestinal:  Positive for abdominal pain and constipation. Negative for vomiting.  Genitourinary:  Positive for vaginal discharge.  Musculoskeletal:  Negative for back pain.  Skin:  Positive for rash.  Neurological:  Negative for weakness.  Psychiatric/Behavioral:   Negative for confusion.    Physical Exam Updated Vital Signs BP 130/74   Pulse 70   Temp 98.1 F (36.7 C) (Oral)   Resp 18   Ht 5\' 3"  (1.6 m)   Wt 81.6 kg   SpO2 99%   BMI 31.89 kg/m   Physical Exam Vitals and nursing note reviewed.  HENT:     Head: Normocephalic.  Cardiovascular:     Rate and Rhythm: Regular rhythm.  Pulmonary:     Breath sounds: Normal breath sounds.  Abdominal:     Comments: Mild suprapubic or lower abdominal tenderness without rebound or guarding.  No hernia palpated  Skin:    General: Skin is warm.  Neurological:     Mental Status: She is alert and oriented to person, place, and time.  Pelvic exam showed minimal vaginal discharge.  IUD strings intact.  No clear adnexal mass but some mild tenderness cervical motion.  ED Results / Procedures / Treatments   Labs (all labs ordered are listed, but only abnormal results are displayed) Labs Reviewed  WET PREP, GENITAL - Abnormal; Notable for the following components:      Result Value   Clue Cells Wet Prep HPF POC PRESENT (*)    WBC, Wet Prep HPF POC MANY (*)    All other components within normal limits  BASIC METABOLIC PANEL - Abnormal; Notable for the following components:   Chloride 112 (*)    All other components within normal limits  URINALYSIS, ROUTINE W REFLEX MICROSCOPIC - Abnormal; Notable for the following components:   APPearance HAZY (*)    Leukocytes,Ua TRACE (*)    All other components within normal limits  CBC WITH DIFFERENTIAL/PLATELET  RPR  HIV ANTIBODY (ROUTINE TESTING W REFLEX)  I-STAT BETA HCG BLOOD, ED ([facility], [facility], [facility] ONLY)  I-STAT BETA HCG BLOOD, ED ([facility], [facility], [facility] ONLY)  GC/CHLAMYDIA PROBE AMP ([place]) NOT AT [facility]    EKG None  Radiology [facility] Pelvis Limited  Result Date: 05/30/2021 CLINICAL DATA:  IUD location EXAM: TRANSABDOMINAL ULTRASOUND OF PELVIS TECHNIQUE: Transabdominal ultrasound examination of the pelvis was performed including evaluation of the uterus, ovaries,  adnexal regions, and pelvic cul-de-sac. COMPARISON:  Same day CT FINDINGS: Uterus Measurements: 7.5 x 3.3 x 4.3 cm (volume = 56 cm^3). There is an intrauterine device which appears to have dropped towards the cervix and is malrotated. No masses or other acute abnormality. Endometrium Thickness: 4.  No focal abnormality visualized. Right ovary Measurements: 3.3 x 2.1 x 2.8 cm (volume = 10 cm^3). Normal appearance/no adnexal mass. Left ovary Measurements: 2.3 x 3.3 x 1.5 cm (volume = 6 cm^3). Normal appearance/no adnexal mass. Other findings:  No abnormal free fluid. IMPRESSION: Malpositioned  intrauterine device, which appears to have rotated and migrated towards the cervix, as seen on recent CT. Normal ovaries. Electronically Signed   By: Caprice Renshaw M.D.   On: 05/30/2021 16:44   CT ABDOMEN PELVIS W CONTRAST  Result Date: 05/30/2021 CLINICAL DATA:  Left lower quadrant abdominal pain. Lower abdominal pain for 2 days. EXAM: CT ABDOMEN AND PELVIS WITH CONTRAST TECHNIQUE: Multidetector CT imaging of the abdomen and pelvis was performed using the standard protocol following bolus administration of intravenous contrast. CONTRAST:  44mL OMNIPAQUE IOHEXOL 350 MG/ML SOLN COMPARISON:  Report from prior CT 05/20/2010. No recent exams available. FINDINGS: Lower chest: The lung bases are clear. No acute airspace disease or pleural effusion. Heart is normal in size. Hepatobiliary: Mildly diffuse decreased density of the liver parenchyma consistent with steatosis. Tiny subcapsular cyst in the anterior left lobe. No suspicious liver lesion. Gallbladder physiologically distended, no calcified stone. No biliary dilatation. Pancreas: No ductal dilatation or inflammation. Spleen: Normal in size without focal abnormality. Small splenule inferiorly. Adrenals/Urinary Tract: Normal adrenal glands. No hydronephrosis or perinephric edema. Homogeneous renal enhancement. No renal calculi or focal lesion. Urinary bladder is physiologically  distended without wall thickening. Stomach/Bowel: The stomach is nondistended. Normal positioning of the duodenum and ligament of Treitz. No small bowel obstruction or inflammatory change. Terminal ileum is normal. Normal appendix. Small volume of colonic stool. No colonic wall thickening or pericolonic edema. No significant diverticular change. No abnormal rectal distention. No perirectal collection. Vascular/Lymphatic: Normal caliber abdominal aorta. Patent portal vein. No acute vascular findings. No abdominopelvic adenopathy. Reproductive: The uterus is anteverted. Intrauterine device is malpositioned with T arms oriented perpendicular to rather than within the endometrium. The long-stem extends close to the cervix. Ovaries are normal in size. Physiologic follicle in the right. No cyst or adnexal mass. Other: No free air, free fluid, or intra-abdominal fluid collection. No abdominal wall hernia. Musculoskeletal: There are no acute or suspicious osseous abnormalities. IMPRESSION: 1. Malpositioned intrauterine device with T arms oriented perpendicular to rather than within the endometrium. The long-stem extends close to the cervix. Recommend pelvic ultrasound for further evaluation. 2. No acute abnormality in the abdomen/pelvis or explanation for left lower quadrant. 3. Mild hepatic steatosis. Electronically Signed   By: Narda Rutherford M.D.   On: 05/30/2021 15:06    Procedures Procedures   Medications Ordered in ED Medications  iohexol (OMNIPAQUE) 350 MG/ML injection 100 mL (80 mLs Intravenous Contrast Given 05/30/21 1439)    ED Course  I have reviewed the triage vital signs and the nursing notes.  Pertinent labs & imaging results that were available during my care of the patient were reviewed by me and considered in my medical decision making (see chart for details).    MDM Rules/Calculators/A&P                           Patient presented with lower abdominal pain.  Also some constipation.   States it feels strange when she tries to have bowel movement.  States she has not really been able to go well.  Also possible vaginal bleeding.  Had previous gonorrhea.  Does have an IUD in place.  With abdominal tenderness CT scan done and overall reassuring.  Although did have potentially malpositioned IUD.  IUD strings were in place on pelvic exam.  Care was signed out to Dr. Wallace Cullens.  We will get ultrasound.  STD testing pending.  Although did have bacterial vaginosis. Patient also had a  mildly scaly rash on arms.  Has had before.  Mother states she gets the same thing.  We will treat with topical steroids and outpatient follow-up. Final Clinical Impression(s) / ED Diagnoses Final diagnoses:  Lower abdominal pain  Bacterial vaginosis  Rash  Intrauterine device (IUD) migration, initial encounter  External hemorrhoid    Rx / DC Orders ED Discharge Orders          Ordered    triamcinolone cream (KENALOG) 0.1 %  2 times daily        05/30/21 1526    senna-docusate (SENOKOT-S) 8.6-50 MG tablet  Daily        05/30/21 1732    metroNIDAZOLE (FLAGYL) 500 MG tablet  2 times daily        05/30/21 1732             Benjiman Core, MD 05/31/21 1439

## 2021-05-30 NOTE — ED Triage Notes (Addendum)
Patient c/o lower abdominal pain x 2 days. Patient unsure if she has vaginal discharge. Patient states she also has hemorrhoids and has noted that she has not had a normal BM in 3 days. Patient also reports that she saw blood from her hemorrhoids. Patient also c/o right arm rash "since the beginning of summer."

## 2021-05-30 NOTE — ED Notes (Signed)
Patient transported to CT 

## 2021-05-30 NOTE — ED Provider Notes (Signed)
   Physical Exam  BP 130/74   Pulse 70   Temp 98.1 F (36.7 C) (Oral)   Resp 18   Ht 5\' 3"  (1.6 m)   Wt 81.6 kg   SpO2 99%   BMI 31.89 kg/m   Physical Exam Vitals and nursing note reviewed.  Constitutional:      General: She is not in acute distress.    Appearance: Normal appearance. She is well-developed. She is obese.  HENT:     Head: Normocephalic and atraumatic.     Right Ear: External ear normal.     Left Ear: External ear normal.     Nose: Nose normal.     Mouth/Throat:     Mouth: Mucous membranes are moist.  Eyes:     General: No scleral icterus.       Right eye: No discharge.        Left eye: No discharge.  Cardiovascular:     Rate and Rhythm: Normal rate and regular rhythm.     Pulses: Normal pulses.     Heart sounds: Normal heart sounds.  Pulmonary:     Effort: Pulmonary effort is normal. No respiratory distress.     Breath sounds: Normal breath sounds.  Abdominal:     General: Abdomen is flat. Bowel sounds are normal.     Tenderness: There is no abdominal tenderness. There is no right CVA tenderness or left CVA tenderness.  Musculoskeletal:        General: Normal range of motion.     Cervical back: Normal range of motion.     Right lower leg: No edema.     Left lower leg: No edema.  Skin:    General: Skin is warm and dry.     Capillary Refill: Capillary refill takes less than 2 seconds.  Neurological:     Mental Status: She is alert.  Psychiatric:        Mood and Affect: Mood normal.        Behavior: Behavior normal.    ED Course/Procedures     Procedures  MDM    30 yo female with complaint of abdominal pain, hemorrhoids, constipation, arm rash; received at hand off from prior physician.   See full note from recent ED physician  Repeat physical exam is stable, abdomen is soft, non tender. No N/V  She reports her symptoms have improved. Vital signs reviewed and are stable.  Discussed findings with the patient.  Will treat for BV with  oral abx. Arm rash with topical steroid. Discussed dietary changes regarding constipation and ways to prevent it. Stool softeners and laxatives. Supportive care regarding hemorrhoids. Sitz baths.   D/w CT / 26 findings reference to IUD with the patient. Per prev physician on pelvic exam the IUD strings are visible on exam. Advised patient to f/u with her OBGYN regarding IUD removal/evaluation.   The patient improved significantly and was discharged in stable condition. Detailed discussions were had with the patient regarding current findings, and need for close f/u with PCP or on call doctor. The patient has been instructed to return immediately if the symptoms worsen in any way for re-evaluation. Patient verbalized understanding and is in agreement with current care plan. All questions answered prior to discharge.      [place], DO 05/30/21 2347

## 2021-05-30 NOTE — ED Notes (Signed)
An After Visit Summary was printed and given to the patient. Discharge instructions given and no further questions at this time.  

## 2021-05-31 LAB — RPR: RPR Ser Ql: NONREACTIVE

## 2021-05-31 LAB — GC/CHLAMYDIA PROBE AMP (~~LOC~~) NOT AT ARMC
Chlamydia: NEGATIVE
Comment: NEGATIVE
Comment: NORMAL
Neisseria Gonorrhea: NEGATIVE

## 2021-06-20 ENCOUNTER — Emergency Department (HOSPITAL_COMMUNITY): Payer: No Typology Code available for payment source

## 2021-06-20 ENCOUNTER — Encounter (HOSPITAL_COMMUNITY): Payer: Self-pay

## 2021-06-20 ENCOUNTER — Emergency Department (HOSPITAL_COMMUNITY)
Admission: EM | Admit: 2021-06-20 | Discharge: 2021-06-21 | Disposition: A | Discharge status: Home / Self Care | Payer: No Typology Code available for payment source | Attending: Emergency Medicine | Admitting: Emergency Medicine

## 2021-06-20 ENCOUNTER — Other Ambulatory Visit: Payer: Self-pay

## 2021-06-20 DIAGNOSIS — Z91010 Allergy to peanuts: Secondary | ICD-10-CM | POA: Insufficient documentation

## 2021-06-20 DIAGNOSIS — R0781 Pleurodynia: Secondary | ICD-10-CM | POA: Insufficient documentation

## 2021-06-20 DIAGNOSIS — M542 Cervicalgia: Secondary | ICD-10-CM | POA: Diagnosis not present

## 2021-06-20 DIAGNOSIS — Z87891 Personal history of nicotine dependence: Secondary | ICD-10-CM | POA: Diagnosis not present

## 2021-06-20 DIAGNOSIS — R109 Unspecified abdominal pain: Secondary | ICD-10-CM | POA: Diagnosis not present

## 2021-06-20 DIAGNOSIS — R062 Wheezing: Secondary | ICD-10-CM | POA: Insufficient documentation

## 2021-06-20 DIAGNOSIS — Y92410 Unspecified street and highway as the place of occurrence of the external cause: Secondary | ICD-10-CM | POA: Diagnosis not present

## 2021-06-20 LAB — I-STAT CHEM 8, ED
BUN: 11 mg/dL (ref 6–20)
Calcium, Ion: 1.2 mmol/L (ref 1.15–1.40)
Chloride: 108 mmol/L (ref 98–111)
Creatinine, Ser: 0.7 mg/dL (ref 0.44–1.00)
Glucose, Bld: 148 mg/dL — ABNORMAL HIGH (ref 70–99)
HCT: 37 % (ref 36.0–46.0)
Hemoglobin: 12.6 g/dL (ref 12.0–15.0)
Potassium: 3.5 mmol/L (ref 3.5–5.1)
Sodium: 139 mmol/L (ref 135–145)
TCO2: 21 mmol/L — ABNORMAL LOW (ref 22–32)

## 2021-06-20 LAB — I-STAT BETA HCG BLOOD, ED (MC, WL, AP ONLY): I-stat hCG, quantitative: <5 m[IU]/mL (ref <5)

## 2021-06-20 MED ORDER — IOHEXOL 350 MG/ML SOLN: 80.0000 mL | Freq: Once | INTRAVENOUS | Status: DC | PRN
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
## 2021-06-21 ENCOUNTER — Emergency Department (HOSPITAL_COMMUNITY): Payer: No Typology Code available for payment source

## 2021-06-21 MED ORDER — ACETAMINOPHEN 500 MG PO TABS
1000.0000 mg | ORAL_TABLET | Freq: Once | ORAL | Status: AC
  Administered 2021-06-21: 1000 mg via ORAL
  Filled 2021-06-21: qty 2

## 2021-06-21 NOTE — ED Notes (Addendum)
Patient refusing CT scan, patient said she does not want to be poked again for an IV. Maralyn Sago, PA notified.

## 2021-06-21 NOTE — ED Notes (Signed)
Unsuccessful IV attempts, IV team consult in place.
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
## 2022-05-11 ENCOUNTER — Other Ambulatory Visit: Payer: Self-pay

## 2022-05-11 ENCOUNTER — Encounter (HOSPITAL_COMMUNITY): Payer: Self-pay

## 2022-05-11 ENCOUNTER — Emergency Department (HOSPITAL_COMMUNITY): Payer: Self-pay

## 2022-05-11 ENCOUNTER — Emergency Department (HOSPITAL_COMMUNITY)
Admission: EM | Admit: 2022-05-11 | Discharge: 2022-05-11 | Disposition: A | Discharge status: Rehabilitation Facility | Payer: Self-pay | Attending: Emergency Medicine | Admitting: Emergency Medicine

## 2022-05-11 DIAGNOSIS — Y990 Civilian activity done for income or pay: Secondary | ICD-10-CM | POA: Insufficient documentation

## 2022-05-11 DIAGNOSIS — M25571 Pain in right ankle and joints of right foot: Secondary | ICD-10-CM | POA: Insufficient documentation

## 2022-05-11 DIAGNOSIS — M25511 Pain in right shoulder: Secondary | ICD-10-CM | POA: Insufficient documentation

## 2022-05-11 DIAGNOSIS — M79641 Pain in right hand: Secondary | ICD-10-CM | POA: Insufficient documentation

## 2022-05-11 DIAGNOSIS — M25512 Pain in left shoulder: Secondary | ICD-10-CM | POA: Insufficient documentation

## 2022-05-11 DIAGNOSIS — Y9389 Activity, other specified: Secondary | ICD-10-CM | POA: Insufficient documentation

## 2022-05-11 DIAGNOSIS — M20021 Boutonniere deformity of right finger(s): Secondary | ICD-10-CM | POA: Insufficient documentation

## 2022-05-11 DIAGNOSIS — Y92812 Truck as the place of occurrence of the external cause: Secondary | ICD-10-CM | POA: Insufficient documentation

## 2022-05-11 DIAGNOSIS — X509XXA Other and unspecified overexertion or strenuous movements or postures, initial encounter: Secondary | ICD-10-CM | POA: Insufficient documentation

## 2022-05-11 MED ORDER — CYCLOBENZAPRINE HCL 10 MG PO TABS: 10.0000 mg | ORAL_TABLET | Freq: Every evening | ORAL | 0 refills | Status: AC | PRN

## 2022-05-11 MED ORDER — ACETAMINOPHEN 325 MG PO TABS
650.0000 mg | ORAL_TABLET | Freq: Once | ORAL | Status: AC
  Administered 2022-05-11: 650 mg via ORAL
  Filled 2022-05-11: qty 2

## 2022-05-11 MED ORDER — IBUPROFEN 400 MG PO TABS
600.0000 mg | ORAL_TABLET | Freq: Once | ORAL | Status: AC
  Administered 2022-05-11: 600 mg via ORAL
  Filled 2022-05-11: qty 1

## 2022-05-11 NOTE — ED Notes (Signed)
Provider notified of the patients request for pain medication via secure chat

## 2022-05-11 NOTE — Discharge Instructions (Addendum)
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  They also show swelling around the right ankle and the evidence of what appears to be a well-healed bone fragment that may be from when you injured your ankle as a child. I have given you the information for a orthopedic/bone specialist.  If you are not improving in the next few days please call them to set up an appointment.  Please take Ibuprofen (Advil, motrin) and Tylenol (acetaminophen) to relieve your pain.    You may take up to 600 MG (3 pills) of normal strength ibuprofen every 8 hours as needed.   You make take tylenol, up to 1,000 mg (two extra strength pills) every 8 hours as needed.   It is safe to take ibuprofen and tylenol at the same time as they work differently.   Do not take more than 3,000 mg tylenol in a 24 hour period (not more than one dose every 8 hours.  Please check all medication labels as many medications such as pain and cold medications may contain tylenol.  Do not drink alcohol while taking these medications.  Do not take other NSAID'S while taking ibuprofen (such as aleve or naproxen).  Please take ibuprofen with food to decrease stomach upset.  You are being prescribed a medication which may make you sleepy. For 24 hours after one dose please do not drive, operate heavy machinery, care for a small child with out another adult present, or perform any activities that may cause harm to you or someone else if you were to fall asleep or be impaired.

## 2022-05-11 NOTE — ED Provider Notes (Signed)
[facility] EMERGENCY DEPARTMENT Provider Note   CSN: [id number] Arrival date & time: 05/11/22  1422     History  Chief Complaint  Patient presents with   Ankle Pain    Christina Owens is a 31 y.o. female who presents today for evaluation after fall. She states that yesterday she was at work getting out of a [employer] truck and rolled her right ankle causing her to fall.  She states that she had immediate onset of ankle pain.  She denies striking her head or passing out.   She states that this morning when she woke up she had some mild pain in her right hand and in her shoulders posterior bilaterally.  She does have a prior injury to her right small finger from childhood per her report.  She states that that is not swollen or change.    HPI     Home Medications Prior to Admission medications   Medication Sig Start Date End Date Taking? Authorizing Provider  cyclobenzaprine (FLEXERIL) 10 MG tablet Take 1 tablet (10 mg total) by mouth at bedtime as needed for muscle spasms. 05/11/22  Yes Cristina Gong, PA-C  acetaminophen (TYLENOL) 500 MG tablet Take 500 mg by mouth every 6 (six) hours as needed for mild pain, fever or headache.    [provider]  hydrocortisone (ANUSOL-HC) 2.5 % rectal cream Place 1 application rectally 4 (four) times daily as needed for hemorrhoids or anal itching.    [provider]  hydrocortisone cream 1 % Apply 1 application topically 4 (four) times daily as needed for itching.    [provider]      Allergies    Peanut-containing drug products      Physical Exam Updated Vital Signs BP 126/70 (BP Location: Right Arm)   Pulse (!) 52   Temp 98.3 F (36.8 C) (Oral)   Resp 16   Ht 5\' 4"  (1.626 m)   Wt 82.6 kg   LMP 03/26/2022 (Approximate)   SpO2 98%   BMI 31.24 kg/m  Physical Exam Vitals and nursing note reviewed.  Constitutional:      General: She is not in acute distress.    Appearance:  Normal appearance. She is not ill-appearing.  HENT:     Head: Normocephalic.  Cardiovascular:     Rate and Rhythm: Normal rate.     Pulses: Normal pulses.     Comments: 2+ right radial pulse, left DP/PT pulse Pulmonary:     Effort: Pulmonary effort is normal. No respiratory distress.  Musculoskeletal:     Cervical back: Normal range of motion and neck supple.     Comments: Obvious edema around the right ankle worse over the lateral malleolus.  She is able to move the toes on her right foot. There is no tenderness to palpation over the right proximal lower leg. Bilateral posterior shoulders are generally tender to palpation across the posterior trapezius muscle.  Palpation here recreates and exacerbates her reported shoulder pain bilaterally.  There is no midline C/upper T-spine tenderness to palpation, step-offs, or deformity. Right small finger is flexed consistent with extensor tendon injury.  Otherwise patient has full range of motion of all fingers on the right hand.  Skin:    Comments: No abrasions lacerations or skin breaks noted over the right hand and right ankle.  Neurological:     General: No focal deficit present.     Mental Status: She is alert.     Sensory: No  sensory deficit.     Motor: No weakness.     ED Results / Procedures / Treatments   Labs (all labs ordered are listed, but only abnormal results are displayed) Labs Reviewed - No data to display  EKG None  Radiology DG Ankle Complete Right  Result Date: 05/11/2022 CLINICAL DATA:  Fall off of truck yesterday. Right ankle pain and swelling. EXAM: RIGHT ANKLE - COMPLETE 3+ VIEW COMPARISON:  None Available. FINDINGS: There is no evidence of acute fracture, dislocation, or joint effusion. Lateral soft tissue swelling is noted. A well-corticated ossific density is seen along the inferior aspect of the lateral malleolus, which may represent accessory ossicle or old avulsion injury. IMPRESSION: Lateral soft tissue  swelling. No acute fracture or dislocation. Electronically Signed   By: Danae Orleans M.D.   On: 05/11/2022 15:28   DG Shoulder Left  Result Date: 05/11/2022 CLINICAL DATA:  Fall out of truck yesterday. Left shoulder injury and pain. EXAM: LEFT SHOULDER - 2+ VIEW COMPARISON:  None Available. FINDINGS: There is no evidence of fracture or dislocation. There is no evidence of arthropathy or other focal bone abnormality. Soft tissues are unremarkable. IMPRESSION: Negative. Electronically Signed   By: Danae Orleans M.D.   On: 05/11/2022 15:27   DG Shoulder Right  Result Date: 05/11/2022 CLINICAL DATA:  Fall out of truck yesterday. Right shoulder injury and pain. EXAM: RIGHT SHOULDER - 2+ VIEW COMPARISON:  None Available. FINDINGS: There is no evidence of fracture or dislocation. There is no evidence of arthropathy or other focal bone abnormality. Soft tissues are unremarkable. IMPRESSION: Negative. Electronically Signed   By: Danae Orleans M.D.   On: 05/11/2022 15:26   DG Hand Complete Right  Result Date: 05/11/2022 CLINICAL DATA:  Larey Seat off of truck yesterday. Right hand injury and pain. EXAM: RIGHT HAND - COMPLETE 3+ VIEW COMPARISON:  None Available. FINDINGS: There is no evidence of fracture or dislocation. There is no evidence of arthropathy or other focal bone abnormality. Boutonniere deformity of the little finger is seen, consistent with extensor mechanism injury. IMPRESSION: No evidence of fracture or dislocation. Boutonniere deformity of the little finger, consistent with extensor mechanism injury. Electronically Signed   By: Danae Orleans M.D.   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 On exam she is neurovascularly intact with limitation of pain with range of motion of the right ankle. She does not have any significant wounds or lacerations on my exam. I suspect her shoulder pain is trapezius muscle spasm related to where she attempted to catch herself while falling. This pain did not start until this morning.  X-rays were obtained. A boutonniere deformity  is noted on the right hand small finger, however patient states that that was a result of an injury she sustained in childhood and is not related to this incident. She does not have tenderness to palpation over anatomic snuffbox.  We discussed use of Flexeril. Recommended OTC medications as needed for pain Addition to conservative care. Outpatient orthopedics follow-up given.  Work note with activity modifications given.  Return precautions were discussed with patient who states their understanding.  At the time of discharge patient denied any unaddressed complaints or concerns.  Patient is agreeable for discharge home.  Note: Portions of this report may have been transcribed using voice recognition software. Every effort was made to ensure accuracy; however, inadvertent computerized transcription errors may be present   With patient's permission care plan was discussed with her and her friend at bedside.  Problems Addressed: Acute pain of both shoulders: acute illness or injury Acute right ankle pain: acute illness or injury Right hand pain: acute illness or injury Work related injury: acute illness or  injury  Amount and/or Complexity of Data Reviewed Independent Historian: friend Radiology: ordered. Decision-making details documented in ED Course.  Risk OTC drugs. Prescription drug management. Decision regarding hospitalization.          Final Clinical Impression(s) / ED Diagnoses Final diagnoses:  Acute right ankle pain  Right hand pain  Acute pain of both shoulders  Work related injury    Rx / DC Orders ED Discharge Orders          Ordered    cyclobenzaprine (FLEXERIL) 10 MG tablet  At bedtime PRN        05/11/22 1701              Norman Clay 05/11/22 2031    Jacalyn Lefevre, MD 05/11/22 2037

## 2022-05-11 NOTE — ED Triage Notes (Signed)
Pt was working for [employer] yesterday when she was getting off the truck and injured her right ankle. Pt currently has ortho boot on foot borrowed from a family member but pt reports pain and swelling to her ankle. Pt also c.o right hand and left shoulder pain from attempting to catch herself.
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
## 2023-06-17 ENCOUNTER — Encounter (HOSPITAL_COMMUNITY): Payer: Self-pay

## 2023-06-17 ENCOUNTER — Other Ambulatory Visit: Payer: Self-pay

## 2023-06-17 ENCOUNTER — Emergency Department (HOSPITAL_COMMUNITY)
Admission: EM | Admit: 2023-06-17 | Discharge: 2023-06-17 | Disposition: A | Discharge status: Home / Self Care | Payer: BLUE CROSS/BLUE SHIELD | Attending: Emergency Medicine | Admitting: Emergency Medicine

## 2023-06-17 DIAGNOSIS — Y92410 Unspecified street and highway as the place of occurrence of the external cause: Secondary | ICD-10-CM | POA: Insufficient documentation

## 2023-06-17 DIAGNOSIS — M542 Cervicalgia: Secondary | ICD-10-CM | POA: Diagnosis present

## 2023-06-17 DIAGNOSIS — M546 Pain in thoracic spine: Secondary | ICD-10-CM | POA: Diagnosis not present

## 2023-06-17 MED ORDER — CELECOXIB 200 MG PO CAPS: 200.0000 mg | ORAL_CAPSULE | Freq: Two times a day (BID) | ORAL | 0 refills | Status: AC
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
## 2024-05-15 ENCOUNTER — Emergency Department (HOSPITAL_COMMUNITY)
Admission: EM | Admit: 2024-05-15 | Discharge: 2024-05-15 | Disposition: A | Discharge status: Home / Self Care | Attending: Emergency Medicine | Admitting: Emergency Medicine

## 2024-05-15 ENCOUNTER — Encounter (HOSPITAL_COMMUNITY): Payer: Self-pay | Admitting: *Deleted

## 2024-05-15 ENCOUNTER — Other Ambulatory Visit: Payer: Self-pay

## 2024-05-15 DIAGNOSIS — R112 Nausea with vomiting, unspecified: Secondary | ICD-10-CM | POA: Diagnosis present

## 2024-05-15 DIAGNOSIS — R1114 Bilious vomiting: Secondary | ICD-10-CM | POA: Insufficient documentation

## 2024-05-15 DIAGNOSIS — Z91010 Allergy to peanuts: Secondary | ICD-10-CM | POA: Diagnosis not present

## 2024-05-15 DIAGNOSIS — R1084 Generalized abdominal pain: Secondary | ICD-10-CM | POA: Diagnosis not present

## 2024-05-15 LAB — CBC WITH DIFFERENTIAL/PLATELET
Abs Immature Granulocytes: 0.02 K/uL (ref 0.00–0.07)
Basophils Absolute: 0.1 K/uL (ref 0.0–0.1)
Basophils Relative: 0 %
Eosinophils Absolute: 0.1 K/uL (ref 0.0–0.5)
Eosinophils Relative: 1 %
HCT: 36 % (ref 36.0–46.0)
Hemoglobin: 12 g/dL (ref 12.0–15.0)
Immature Granulocytes: 0 %
Lymphocytes Relative: 28 %
Lymphs Abs: 3.1 K/uL (ref 0.7–4.0)
MCH: 28.7 pg (ref 26.0–34.0)
MCHC: 33.3 g/dL (ref 30.0–36.0)
MCV: 86.1 fL (ref 80.0–100.0)
Monocytes Absolute: 0.9 K/uL (ref 0.1–1.0)
Monocytes Relative: 8 %
Neutro Abs: 7.1 K/uL (ref 1.7–7.7)
Neutrophils Relative %: 63 %
Platelets: 311 K/uL (ref 150–400)
RBC: 4.18 MIL/uL (ref 3.87–5.11)
RDW: 14.1 % (ref 11.5–15.5)
WBC: 11.3 K/uL — ABNORMAL HIGH (ref 4.0–10.5)
nRBC: 0 % (ref 0.0–0.2)

## 2024-05-15 LAB — COMPREHENSIVE METABOLIC PANEL WITH GFR
ALT: 43 U/L (ref 0–44)
AST: 30 U/L (ref 15–41)
Albumin: 3.5 g/dL (ref 3.5–5.0)
Alkaline Phosphatase: 60 U/L (ref 38–126)
Anion gap: 7 (ref 5–15)
BUN: 13 mg/dL (ref 6–20)
CO2: 22 mmol/L (ref 22–32)
Calcium: 8.6 mg/dL — ABNORMAL LOW (ref 8.9–10.3)
Chloride: 108 mmol/L (ref 98–111)
Creatinine, Ser: 0.74 mg/dL (ref 0.44–1.00)
GFR, Estimated: >60 mL/min (ref >60)
Glucose, Bld: 105 mg/dL — ABNORMAL HIGH (ref 70–99)
Potassium: 3.3 mmol/L — ABNORMAL LOW (ref 3.5–5.1)
Sodium: 137 mmol/L (ref 135–145)
Total Bilirubin: 0.6 mg/dL (ref 0.0–1.2)
Total Protein: 7.1 g/dL (ref 6.5–8.1)

## 2024-05-15 LAB — LIPASE, BLOOD: Lipase: 26 U/L (ref 11–51)

## 2024-05-15 LAB — HCG, SERUM, QUALITATIVE: Preg, Serum: NEGATIVE

## 2024-05-15 MED ORDER — ONDANSETRON 4 MG PO TBDP: 4.0000 mg | ORAL_TABLET | Freq: Three times a day (TID) | ORAL | 0 refills | Status: AC | PRN

## 2024-05-15 MED ORDER — ONDANSETRON HCL 4 MG/2ML IJ SOLN
4.0000 mg | Freq: Once | INTRAMUSCULAR | Status: AC
  Administered 2024-05-15: 4 mg via INTRAVENOUS
  Filled 2024-05-15: qty 2

## 2024-05-15 MED ORDER — SODIUM CHLORIDE 0.9 % IV BOLUS
1000.0000 mL | Freq: Once | INTRAVENOUS | Status: AC
  Administered 2024-05-15: 1000 mL via INTRAVENOUS

## 2024-05-15 NOTE — ED Provider Notes (Signed)
 [place] EMERGENCY DEPARTMENT AT [facility] Provider Note   CSN: [id number] Arrival date & time: 05/15/24  1633     Patient presents with: Nausea   Christina Owens is a 33 y.o. female.   HPI Patient presents diffuse abdominal discomfort, not pain, uneasy sensation.  He notes that he ate at a [employer], believes that there was mold in the rice.  She has had 2 episodes of vomiting since then, no diarrhea.    Prior to Admission medications   Medication Sig Start Date End Date Taking? Authorizing Provider  ondansetron  (ZOFRAN -ODT) 4 MG disintegrating tablet Take 1 tablet (4 mg total) by mouth every 8 (eight) hours as needed for nausea or vomiting. 05/15/24  Yes Garrick Charleston, MD  acetaminophen  (TYLENOL ) 500 MG tablet Take 500 mg by mouth every 6 (six) hours as needed for mild pain, fever or headache.    [provider]  celecoxib  (CELEBREX ) 200 MG capsule Take 1 capsule (200 mg total) by mouth 2 (two) times daily. 06/17/23   Jerral Meth, MD  cyclobenzaprine  (FLEXERIL ) 10 MG tablet Take 1 tablet (10 mg total) by mouth at bedtime as needed for muscle spasms. 05/11/22   Windle Almarie ORN, PA-C  hydrocortisone (ANUSOL-HC) 2.5 % rectal cream Place 1 application rectally 4 (four) times daily as needed for hemorrhoids or anal itching.    [provider]  hydrocortisone cream 1 % Apply 1 application topically 4 (four) times daily as needed for itching.    [provider]    Allergies: Peanut-containing drug products    Review of Systems  Updated Vital Signs BP 115/72 (BP Location: Left Arm)   Pulse 69   Temp 98.2 F (36.8 C) (Oral)   Resp 16   Wt 81.6 kg   LMP 05/13/2024 (Exact Date)   SpO2 96%   BMI 30.90 kg/m   Physical Exam Vitals and nursing note reviewed.  Constitutional:      General: She is not in acute distress.    Appearance: She is well-developed.  HENT:     Head: Normocephalic and atraumatic.  Eyes:      Conjunctiva/sclera: Conjunctivae normal.  Cardiovascular:     Rate and Rhythm: Normal rate and regular rhythm.  Pulmonary:     Effort: Pulmonary effort is normal. No respiratory distress.     Breath sounds: Normal breath sounds. No stridor.  Abdominal:     General: There is no distension.     Tenderness: There is no abdominal tenderness. There is no guarding.  Skin:    General: Skin is warm and dry.  Neurological:     Mental Status: She is alert and oriented to person, place, and time.     Cranial Nerves: No cranial nerve deficit.  Psychiatric:        Mood and Affect: Mood normal.     (all labs ordered are listed, but only abnormal results are displayed) Labs Reviewed  CBC WITH DIFFERENTIAL/PLATELET - Abnormal; Notable for the following components:      Result Value   WBC 11.3 (*)    All other components within normal limits  COMPREHENSIVE METABOLIC PANEL WITH GFR - Abnormal; Notable for the following components:   Potassium 3.3 (*)    Glucose, Bld 105 (*)    Calcium  8.6 (*)    All other components within normal limits  LIPASE, BLOOD  HCG, SERUM, QUALITATIVE    EKG: None  Radiology: No results found.   Procedures  Medications Ordered in the ED  sodium chloride  0.9 % bolus 1,000 mL (1,000 mLs Intravenous New Bag/Given 05/15/24 1746)  ondansetron  (ZOFRAN ) injection 4 mg (4 mg Intravenous Given 05/15/24 1744)                                    Medical Decision Making Adult female presents with sudden onset nausea, vomiting, abdominal discomfort after eating at a fast food restaurant.  Patient endorses seeing something unusual, possibly mold in her rice.  She is awake, alert, afebrile and has no abdominal tenderness to exam, suspicion for food reaction. Labs sent fluids antiemetics provided.  Amount and/or Complexity of Data Reviewed Labs: ordered. Decision-making details documented in ED Course.  Risk Prescription drug management.   7:32 PM Patient in no  distress, no additional vomiting here, vitals unremarkable, still no focal pain on repeat abdominal exam.  Specimen leukocytosis, likely reactive, but remains afebrile and otherwise hemodynamically stable, appropriate for discharge.     Final diagnoses:  Bilious vomiting with nausea    ED Discharge Orders          Ordered    ondansetron  (ZOFRAN -ODT) 4 MG disintegrating tablet  Every 8 hours PRN        05/15/24 1932               Garrick Charleston, MD 05/15/24 1932

## 2024-05-15 NOTE — Discharge Instructions (Signed)
 As discussed, your evaluation today has been largely reassuring.  But, it is important that you monitor your condition carefully, and do not hesitate to return to the ED if you develop new, or concerning changes in your condition. ? ?Otherwise, please follow-up with your physician for appropriate ongoing care. ? ?

## 2024-05-15 NOTE — ED Triage Notes (Addendum)
 Here by POV from home for feeling weird, queazy and stomach churning after eating moldy rice at [employer]. Endorses NV, stomach discomfort 6/10. Denies fever. Occurred ~ 45 minutes ago, vomited x2 just PTA. Felt fine prior to eating. Denies recent sick contacts or others with similar sx. Alert, NAD, calm, interactive, steady gait.
